# Patient Record
Sex: Male | Born: 1960
Health system: Southern US, Community
[De-identification: ages and names within clinical notes are randomized; demographics above are authoritative.]

## PROBLEM LIST (undated history)

## (undated) DIAGNOSIS — R3129 Other microscopic hematuria: Secondary | ICD-10-CM

## (undated) DIAGNOSIS — I70209 Unspecified atherosclerosis of native arteries of extremities, unspecified extremity: Secondary | ICD-10-CM

## (undated) DIAGNOSIS — E7800 Pure hypercholesterolemia, unspecified: Secondary | ICD-10-CM

## (undated) DIAGNOSIS — N138 Other obstructive and reflux uropathy: Secondary | ICD-10-CM

## (undated) DIAGNOSIS — K649 Unspecified hemorrhoids: Secondary | ICD-10-CM

## (undated) DIAGNOSIS — I739 Peripheral vascular disease, unspecified: Secondary | ICD-10-CM

## (undated) DIAGNOSIS — R7303 Prediabetes: Secondary | ICD-10-CM

## (undated) DIAGNOSIS — N401 Enlarged prostate with lower urinary tract symptoms: Secondary | ICD-10-CM

## (undated) DIAGNOSIS — K219 Gastro-esophageal reflux disease without esophagitis: Secondary | ICD-10-CM

## (undated) DIAGNOSIS — E559 Vitamin D deficiency, unspecified: Secondary | ICD-10-CM

## (undated) DIAGNOSIS — I10 Essential (primary) hypertension: Secondary | ICD-10-CM

## (undated) DIAGNOSIS — Z125 Encounter for screening for malignant neoplasm of prostate: Secondary | ICD-10-CM

## (undated) DIAGNOSIS — N201 Calculus of ureter: Secondary | ICD-10-CM

## (undated) DIAGNOSIS — N433 Hydrocele, unspecified: Secondary | ICD-10-CM

## (undated) HISTORY — DX: Gastro-esophageal reflux disease without esophagitis: K21.9

## (undated) HISTORY — DX: Unspecified atherosclerosis of native arteries of extremities, unspecified extremity: I70.209

## (undated) HISTORY — DX: Peripheral vascular disease, unspecified: I73.9

## (undated) HISTORY — DX: Encounter for screening for malignant neoplasm of prostate: Z12.5

## (undated) HISTORY — PX: COLONOSCOPY: SHX174

## (undated) HISTORY — DX: Other microscopic hematuria: R31.29

## (undated) HISTORY — DX: Pure hypercholesterolemia, unspecified: E78.00

## (undated) HISTORY — DX: Other obstructive and reflux uropathy: N40.1

## (undated) HISTORY — DX: Benign prostatic hyperplasia with lower urinary tract symptoms: N13.8

## (undated) HISTORY — PX: KNEE SURGERY: SHX244

## (undated) HISTORY — DX: Unspecified hemorrhoids: K64.9

## (undated) HISTORY — DX: Calculus of ureter: N20.1

## (undated) HISTORY — DX: Vitamin D deficiency, unspecified: E55.9

## (undated) HISTORY — DX: Prediabetes: R73.03

## (undated) HISTORY — PX: SHOULDER SURGERY: SHX246

## (undated) HISTORY — DX: Hydrocele, unspecified: N43.3

---

## 1999-02-22 ENCOUNTER — Emergency Department (HOSPITAL_COMMUNITY): Admission: EM | Admit: 1999-02-22 | Discharge: 1999-02-22 | Payer: Self-pay | Admitting: Emergency Medicine

## 2000-08-04 ENCOUNTER — Emergency Department (HOSPITAL_COMMUNITY): Admission: EM | Admit: 2000-08-04 | Discharge: 2000-08-05 | Payer: Self-pay | Admitting: Emergency Medicine

## 2000-08-20 ENCOUNTER — Emergency Department (HOSPITAL_COMMUNITY): Admission: EM | Admit: 2000-08-20 | Discharge: 2000-08-20 | Payer: Self-pay | Admitting: *Deleted

## 2000-08-27 ENCOUNTER — Encounter: Payer: Self-pay | Admitting: Orthopedic Surgery

## 2000-08-27 ENCOUNTER — Ambulatory Visit: Admission: RE | Admit: 2000-08-27 | Discharge: 2000-08-27 | Payer: Self-pay | Admitting: Orthopedic Surgery

## 2002-02-04 ENCOUNTER — Ambulatory Visit: Admission: RE | Admit: 2002-02-04 | Discharge: 2002-02-04 | Payer: Self-pay | Admitting: Orthopedic Surgery

## 2002-02-04 ENCOUNTER — Ambulatory Visit (HOSPITAL_COMMUNITY): Admission: RE | Admit: 2002-02-04 | Discharge: 2002-02-04 | Payer: Self-pay | Admitting: Orthopedic Surgery

## 2002-02-04 ENCOUNTER — Encounter: Payer: Self-pay | Admitting: Orthopedic Surgery

## 2002-03-09 ENCOUNTER — Encounter: Admission: RE | Admit: 2002-03-09 | Discharge: 2002-06-07 | Payer: Self-pay | Admitting: Orthopedic Surgery

## 2004-01-14 ENCOUNTER — Emergency Department (HOSPITAL_COMMUNITY): Admission: EM | Admit: 2004-01-14 | Discharge: 2004-01-14 | Payer: Self-pay

## 2004-12-09 ENCOUNTER — Emergency Department (HOSPITAL_COMMUNITY): Admission: EM | Admit: 2004-12-09 | Discharge: 2004-12-09 | Payer: Self-pay | Admitting: Emergency Medicine

## 2005-05-23 ENCOUNTER — Emergency Department (HOSPITAL_COMMUNITY): Admission: EM | Admit: 2005-05-23 | Discharge: 2005-05-23 | Payer: Self-pay | Admitting: Emergency Medicine

## 2005-09-21 ENCOUNTER — Emergency Department (HOSPITAL_COMMUNITY): Admission: EM | Admit: 2005-09-21 | Discharge: 2005-09-22 | Payer: Self-pay | Admitting: *Deleted

## 2006-03-07 ENCOUNTER — Emergency Department (HOSPITAL_COMMUNITY): Admission: EM | Admit: 2006-03-07 | Discharge: 2006-03-08 | Payer: Self-pay | Admitting: Emergency Medicine

## 2006-06-05 ENCOUNTER — Encounter: Admission: RE | Admit: 2006-06-05 | Discharge: 2006-06-05 | Payer: Self-pay | Admitting: *Deleted

## 2006-06-10 ENCOUNTER — Encounter: Payer: Self-pay | Admitting: Gastroenterology

## 2007-06-04 ENCOUNTER — Emergency Department (HOSPITAL_COMMUNITY): Admission: EM | Admit: 2007-06-04 | Discharge: 2007-06-05 | Payer: Self-pay | Admitting: Emergency Medicine

## 2007-12-10 ENCOUNTER — Emergency Department (HOSPITAL_BASED_OUTPATIENT_CLINIC_OR_DEPARTMENT_OTHER): Admission: EM | Admit: 2007-12-10 | Discharge: 2007-12-10 | Payer: Self-pay | Admitting: Emergency Medicine

## 2007-12-11 ENCOUNTER — Emergency Department (HOSPITAL_COMMUNITY): Admission: EM | Admit: 2007-12-11 | Discharge: 2007-12-11 | Payer: Self-pay | Admitting: Family Medicine

## 2008-01-07 ENCOUNTER — Ambulatory Visit: Payer: Self-pay | Admitting: Internal Medicine

## 2008-01-07 ENCOUNTER — Encounter (INDEPENDENT_AMBULATORY_CARE_PROVIDER_SITE_OTHER): Payer: Self-pay | Admitting: *Deleted

## 2008-01-07 DIAGNOSIS — I10 Essential (primary) hypertension: Secondary | ICD-10-CM | POA: Insufficient documentation

## 2008-01-07 DIAGNOSIS — R9431 Abnormal electrocardiogram [ECG] [EKG]: Secondary | ICD-10-CM | POA: Insufficient documentation

## 2008-01-07 DIAGNOSIS — K219 Gastro-esophageal reflux disease without esophagitis: Secondary | ICD-10-CM | POA: Insufficient documentation

## 2008-01-07 DIAGNOSIS — N401 Enlarged prostate with lower urinary tract symptoms: Secondary | ICD-10-CM | POA: Insufficient documentation

## 2008-01-08 ENCOUNTER — Encounter: Payer: Self-pay | Admitting: Internal Medicine

## 2008-02-01 ENCOUNTER — Ambulatory Visit: Payer: Self-pay | Admitting: Internal Medicine

## 2008-02-01 DIAGNOSIS — E876 Hypokalemia: Secondary | ICD-10-CM | POA: Insufficient documentation

## 2008-02-01 DIAGNOSIS — R7309 Other abnormal glucose: Secondary | ICD-10-CM | POA: Insufficient documentation

## 2008-02-01 LAB — CONVERTED CEMR LAB
BUN: 8 mg/dL (ref 6–23)
Basophils Relative: 0.1 % (ref 0.0–3.0)
CO2: 30 meq/L (ref 19–32)
Calcium: 9.2 mg/dL (ref 8.4–10.5)
Chloride: 105 meq/L (ref 96–112)
Creatinine, Ser: 1 mg/dL (ref 0.4–1.5)
GFR calc Af Amer: 102 mL/min
Glucose, Bld: 105 mg/dL — ABNORMAL HIGH (ref 70–99)
HCT: 44.7 % (ref 39.0–52.0)
Hemoglobin: 15 g/dL (ref 13.0–17.0)
MCV: 88 fL (ref 78.0–100.0)
Neutro Abs: 2.9 10*3/uL (ref 1.4–7.7)
Neutrophils Relative %: 53.7 % (ref 43.0–77.0)
Sodium: 142 meq/L (ref 135–145)

## 2008-02-03 ENCOUNTER — Encounter: Admission: RE | Admit: 2008-02-03 | Discharge: 2008-02-03 | Payer: Self-pay | Admitting: Internal Medicine

## 2008-02-03 ENCOUNTER — Encounter: Payer: Self-pay | Admitting: Internal Medicine

## 2008-02-03 ENCOUNTER — Ambulatory Visit: Payer: Self-pay | Admitting: Gastroenterology

## 2008-02-05 ENCOUNTER — Ambulatory Visit: Payer: Self-pay | Admitting: Gastroenterology

## 2008-02-05 HISTORY — PX: COLONOSCOPY: SHX174

## 2008-02-11 ENCOUNTER — Ambulatory Visit: Payer: Self-pay | Admitting: Gastroenterology

## 2008-02-17 ENCOUNTER — Ambulatory Visit: Payer: Self-pay | Admitting: Gastroenterology

## 2008-02-17 ENCOUNTER — Encounter: Payer: Self-pay | Admitting: Internal Medicine

## 2008-02-22 ENCOUNTER — Encounter: Payer: Self-pay | Admitting: Gastroenterology

## 2008-02-25 ENCOUNTER — Ambulatory Visit: Payer: Self-pay | Admitting: Internal Medicine

## 2008-03-28 ENCOUNTER — Telehealth: Payer: Self-pay | Admitting: Gastroenterology

## 2008-03-30 ENCOUNTER — Ambulatory Visit: Payer: Self-pay | Admitting: Gastroenterology

## 2008-04-04 ENCOUNTER — Telehealth: Payer: Self-pay | Admitting: Gastroenterology

## 2008-04-05 ENCOUNTER — Ambulatory Visit: Payer: Self-pay | Admitting: Gastroenterology

## 2008-04-12 ENCOUNTER — Ambulatory Visit: Payer: Self-pay | Admitting: Gastroenterology

## 2008-04-12 ENCOUNTER — Encounter: Payer: Self-pay | Admitting: Gastroenterology

## 2008-04-15 ENCOUNTER — Encounter: Payer: Self-pay | Admitting: Gastroenterology

## 2008-04-15 ENCOUNTER — Telehealth: Payer: Self-pay | Admitting: Gastroenterology

## 2008-04-15 DIAGNOSIS — R142 Eructation: Secondary | ICD-10-CM

## 2008-04-15 DIAGNOSIS — R141 Gas pain: Secondary | ICD-10-CM | POA: Insufficient documentation

## 2008-04-15 DIAGNOSIS — R143 Flatulence: Secondary | ICD-10-CM

## 2008-04-26 ENCOUNTER — Telehealth: Payer: Self-pay | Admitting: Gastroenterology

## 2008-04-29 ENCOUNTER — Ambulatory Visit (HOSPITAL_COMMUNITY): Admission: RE | Admit: 2008-04-29 | Discharge: 2008-04-29 | Payer: Self-pay | Admitting: Gastroenterology

## 2008-05-05 ENCOUNTER — Ambulatory Visit: Payer: Self-pay | Admitting: Gastroenterology

## 2008-06-15 ENCOUNTER — Ambulatory Visit: Payer: Self-pay | Admitting: Internal Medicine

## 2008-07-28 ENCOUNTER — Ambulatory Visit: Payer: Self-pay | Admitting: Internal Medicine

## 2008-07-28 LAB — CONVERTED CEMR LAB
Chloride: 102 meq/L (ref 96–112)
Creatinine, Ser: 1 mg/dL (ref 0.4–1.5)
Glucose, Bld: 84 mg/dL (ref 70–99)
Sodium: 141 meq/L (ref 135–145)

## 2008-09-13 ENCOUNTER — Telehealth: Payer: Self-pay | Admitting: Gastroenterology

## 2008-09-16 ENCOUNTER — Telehealth: Payer: Self-pay | Admitting: Gastroenterology

## 2008-10-11 ENCOUNTER — Telehealth: Payer: Self-pay | Admitting: Gastroenterology

## 2008-10-18 ENCOUNTER — Ambulatory Visit: Payer: Self-pay | Admitting: Internal Medicine

## 2008-10-28 ENCOUNTER — Encounter: Admission: RE | Admit: 2008-10-28 | Discharge: 2008-10-28 | Payer: Self-pay | Admitting: Orthopedic Surgery

## 2008-11-22 ENCOUNTER — Encounter: Admission: RE | Admit: 2008-11-22 | Discharge: 2008-11-22 | Payer: Self-pay | Admitting: Orthopedic Surgery

## 2009-03-13 ENCOUNTER — Emergency Department (HOSPITAL_BASED_OUTPATIENT_CLINIC_OR_DEPARTMENT_OTHER): Admission: EM | Admit: 2009-03-13 | Discharge: 2009-03-13 | Payer: Self-pay | Admitting: Emergency Medicine

## 2009-03-13 ENCOUNTER — Ambulatory Visit: Payer: Self-pay | Admitting: Diagnostic Radiology

## 2009-03-21 ENCOUNTER — Ambulatory Visit (HOSPITAL_BASED_OUTPATIENT_CLINIC_OR_DEPARTMENT_OTHER): Admission: RE | Admit: 2009-03-21 | Discharge: 2009-03-21 | Payer: Self-pay | Admitting: Internal Medicine

## 2009-03-21 ENCOUNTER — Ambulatory Visit: Payer: Self-pay | Admitting: Diagnostic Radiology

## 2010-02-18 ENCOUNTER — Encounter: Payer: Self-pay | Admitting: Orthopedic Surgery

## 2010-02-18 ENCOUNTER — Encounter: Payer: Self-pay | Admitting: *Deleted

## 2010-02-25 LAB — CONVERTED CEMR LAB
AST: 22 units/L (ref 0–37)
Basophils Absolute: 0 10*3/uL (ref 0.0–0.1)
Bilirubin Urine: NEGATIVE
Chloride: 103 meq/L (ref 96–112)
Creatinine, Ser: 1 mg/dL (ref 0.4–1.5)
Crystals: NEGATIVE
Direct LDL: 168 mg/dL
Eosinophils Absolute: 0.2 10*3/uL (ref 0.0–0.7)
GFR calc Af Amer: 102 mL/min
Glucose, Bld: 109 mg/dL — ABNORMAL HIGH (ref 70–99)
Hemoglobin: 14.6 g/dL (ref 13.0–17.0)
Ketones, ur: NEGATIVE mg/dL
Lymphocytes Relative: 57.3 % — ABNORMAL HIGH (ref 12.0–46.0)
MCHC: 33.7 g/dL (ref 30.0–36.0)
Monocytes Relative: 9.2 % (ref 3.0–12.0)
Neutrophils Relative %: 29.6 % — ABNORMAL LOW (ref 43.0–77.0)
Nitrite: NEGATIVE
PSA: 0.65 ng/mL (ref 0.10–4.00)
Platelets: 252 10*3/uL (ref 150–400)
RDW: 12.9 % (ref 11.5–14.6)
Sodium: 139 meq/L (ref 135–145)
Specific Gravity, Urine: 1.015 (ref 1.000–1.03)
TSH: 0.96 microintl units/mL (ref 0.35–5.50)
Total CHOL/HDL Ratio: 5.7
Triglycerides: 58 mg/dL (ref 0–149)
VLDL: 12 mg/dL (ref 0–40)
WBC: 4.7 10*3/uL (ref 4.5–10.5)

## 2010-04-19 LAB — URINALYSIS, ROUTINE W REFLEX MICROSCOPIC
Bilirubin Urine: NEGATIVE
Glucose, UA: NEGATIVE mg/dL
Ketones, ur: NEGATIVE mg/dL
Nitrite: NEGATIVE
Protein, ur: NEGATIVE mg/dL
Urobilinogen, UA: 0.2 mg/dL (ref 0.0–1.0)

## 2010-04-19 LAB — CBC
HCT: 43 % (ref 39.0–52.0)
MCHC: 33.3 g/dL (ref 30.0–36.0)
MCV: 87.1 fL (ref 78.0–100.0)
Platelets: 229 10*3/uL (ref 150–400)
RBC: 4.94 MIL/uL (ref 4.22–5.81)
RDW: 12.8 % (ref 11.5–15.5)

## 2010-04-19 LAB — COMPREHENSIVE METABOLIC PANEL
ALT: 29 U/L (ref 0–53)
AST: 23 U/L (ref 0–37)
Albumin: 4 g/dL (ref 3.5–5.2)
CO2: 34 mEq/L — ABNORMAL HIGH (ref 19–32)
Calcium: 8.7 mg/dL (ref 8.4–10.5)
Chloride: 103 mEq/L (ref 96–112)
GFR calc non Af Amer: 54 mL/min — ABNORMAL LOW (ref >60)
Glucose, Bld: 94 mg/dL (ref 70–99)
Sodium: 142 mEq/L (ref 135–145)

## 2010-04-19 LAB — DIFFERENTIAL
Basophils Absolute: 0.2 10*3/uL — ABNORMAL HIGH (ref 0.0–0.1)
Eosinophils Relative: 3 % (ref 0–5)
Lymphs Abs: 2.4 10*3/uL (ref 0.7–4.0)
Monocytes Absolute: 0.4 10*3/uL (ref 0.1–1.0)
Neutro Abs: 2.2 10*3/uL (ref 1.7–7.7)

## 2010-06-15 NOTE — H&P (Signed)
   NAME:  Christopher Cobb, Christopher Cobb NO.:  [id number]   MEDICAL RECORD NO.:  [id number]                   PATIENT TYPE:  INP   LOCATION:  NA                                   FACILITY:  [facility]   PHYSICIAN:  Myrtie Neither, M.D.                 DATE OF BIRTH:  [birth year]   DATE OF ADMISSION:  DATE OF DISCHARGE:                                HISTORY & PHYSICAL   ADDENDUM:   PLAN:  Arthroscopic anterior cruciate ligament reconstruction with use of  patellar tendon and meniscectomy, right knee.                                               Myrtie Neither, M.D.    AC/MEDQ  D:  02/04/2002  T:  02/04/2002  Job:  [id number]

## 2010-06-15 NOTE — Op Note (Signed)
NAME:  Christopher Cobb, Christopher Cobb NO.:  [id number]   MEDICAL RECORD NO.:  [id number]                   PATIENT TYPE:  INP   LOCATION:  NA                                   FACILITY:  [facility]   PHYSICIAN:  Myrtie Neither, M.D.                 DATE OF BIRTH:  21-Oct-1960   DATE OF PROCEDURE:  02/04/2002  DATE OF DISCHARGE:                                 OPERATIVE REPORT   PREOPERATIVE DIAGNOSIS:  Anterior cruciate ligament tear, meniscal tear  right internal derangement, right knee.   POSTOPERATIVE DIAGNOSIS:  Lateral meniscal tear, chronic synovitis, partial  tear anterior cruciate ligament, synovial cyst, right knee.   ANESTHESIA:  General.   PROCEDURE:  Arthroscopic synovectomy, partial lateral meniscectomy, shaving  and resection of synovial cystic lesion, anterior compartment and partial  resection of avascular portion of the anterior cruciate ligament.   DESCRIPTION OF PROCEDURE:  The patient was taken to the operating room and  was given adequate preoperative medication. He was given general anesthesia  and intubated. The right knee was prepped with Duraprep and prepped and  draped in a sterile manner. A tourniquet was used for hemostasis.   A 1/2 inch puncture wound was made along the anterior medial and lateral  jointline and carried down through the medial suprapatellar pouch area.  Inspection of the joint revealed partial tear of the lateral meniscus,  chronically thickened anterior synovium anterior compartment, partial tear  of the anterior cruciate ligament along its lateral border which appeared to  be avascular; 80% of the ACL was intact. This was tested with the use of a  probe and further inspected.   There was a large synovial cystic lesion which was encompassed in the  anterior compartment of the joint on full extension of the knee. This was  found to impinge against the femoral condyle. This was also right at the  base of the anterior  cruciate ligament, giving the appearance of a stump of  the ACL.   With the synovial shaver, this was completely resected, as well as the  basket forceps. Resection of the avascular small section of the ACL. The  lateral meniscal tear was a degenerative tear and was resected with the use  of basket forceps. The medial meniscus was well preserved. The medial  compartment was well preserved.   Further copious and abundant irrigation was done. The PCL appears to be  intact along its femoral attachment. Further inspection did not reveal any  other  loose fragments.   Wound closure was then done with 4-0 nylon. A compressive dressing was  applied. A knee immobilizer was applied.   The patient tolerated the procedure well and went to the recovery room in  stable and satisfactory condition. The patient was  then discharged to home  on Percocet       1 q.4h. p.r.n. pain. He was advised about the use of  crutches. He was to  return to the office in one week. The patient is being  discharged in stable and satisfactory condition.                                               Myrtie Neither, M.D.    AC/MEDQ  D:  02/04/2002  T:  02/04/2002  Job:  [id number]

## 2010-06-15 NOTE — H&P (Signed)
   NAME:  Christopher Cobb, LANSDOWNE NO.:  [id number]   MEDICAL RECORD NO.:  [id number]                   PATIENT TYPE:  INP   LOCATION:  NA                                   FACILITY:  [facility]   PHYSICIAN:  Myrtie Neither, M.D.                 DATE OF BIRTH:  1961-01-06   DATE OF ADMISSION:  DATE OF DISCHARGE:                                HISTORY & PHYSICAL   CHIEF COMPLAINT:  Painful, locking and giving away of the right knee.   HISTORY OF PRESENT ILLNESS:  This is a 50 year old male who was seen in the  office for pain, catching, locking and swelling of the right knee and a  pinching sensation, difficulty standing for any long period of time with  buckling of the right knee. The patient had an MRI which demonstrated ACL  tear and meniscal tear. The patient had been treated with some  antiinflammatories, water exercises and knee support.   PAST MEDICAL HISTORY:  No history of high blood pressure or diabetes.   ALLERGIES:  SHRIMP.   MEDICATIONS:  Vioxx.   SOCIAL HISTORY:  The patient lives with is wife. He denies smoking or use of  alcohol.   REVIEW OF SYSTEMS:  Basically as per history of present illness. No chronic  respiratory, no urinary or bowel symptoms.   PAST MEDICAL HISTORY:  Left knee arthroscopy and right shoulder arthroscopy.   FAMILY HISTORY:  Noncontributory.   PHYSICAL EXAMINATION:  VITAL SIGNS:  Temperature 99.3, pulse 74,  respirations 16, blood pressure 136/89.  GENERAL:  Height 5 feet, 11 inches. Weight 224. Alert and oriented, in no  acute distress.  HEENT:  Normocephalic, conjunctivae are clear.  NECK:  Supple.  CHEST:  Clear.  CARDIAC:  S1, S2 regular.  EXTREMITIES:  Right knee posterior effusion. Tender anterior jointline with  palpable and audible click, anterior and lateral compartment. Positive  anterior drawer sign. Trace Lachman's test. Quad muscle tone fair. Pain on  full extension of  the right knee and on McMurray's  test with some palpable  and audible click laterally.   LABORATORY DATA:  The patient had MRI which demonstrated ACL tear and  meniscal tear.   IMPRESSION:  Internal derangement, meniscal tear, anterior cruciate ligament  tear, right knee.                                              Myrtie Neither, M.D.   AC/MEDQ  D:  02/04/2002  T:  02/04/2002  Job:  [id number]

## 2010-10-24 IMAGING — CT CT CHEST W/O CM
2 of 3 series · 15 of 36 positions shown, 18 images · non-contrast
Comparison: 10/28/2008

CLINICAL DATA: Abnormal chest x-ray.  Chest pain.

CT CHEST WITHOUT CONTRAST
TECHNIQUE: Multidetector CT imaging of the chest was performed
following the standard protocol without IV contrast.

[Series 2: chest 5.0 b31f · axial · 0.77mm/px · z∈[-275,-55]mm · 12 of 52 slices shown, 15 images]
[im 4/52  mediastinal]
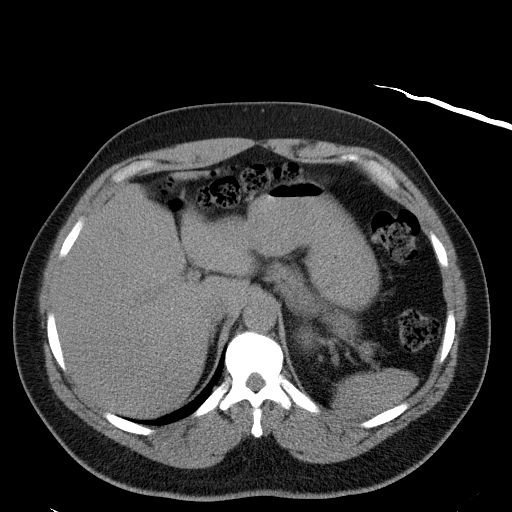
[im 4/52  lung]
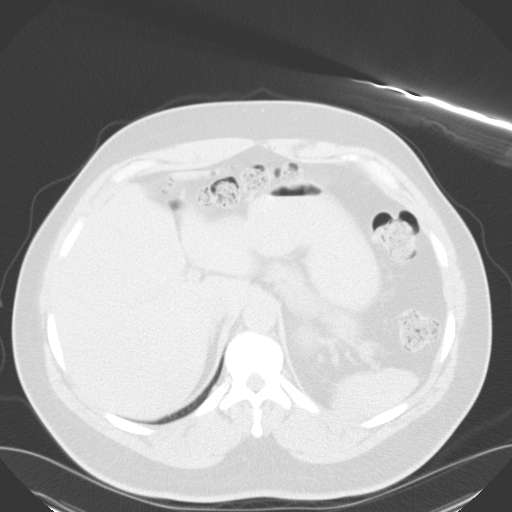
[im 8/52  lung]
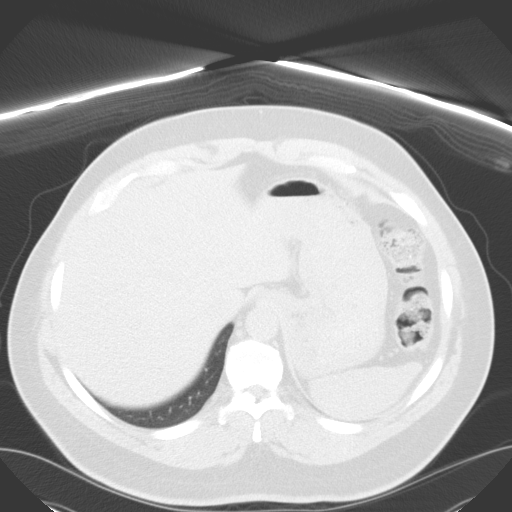
[im 12/52  lung]
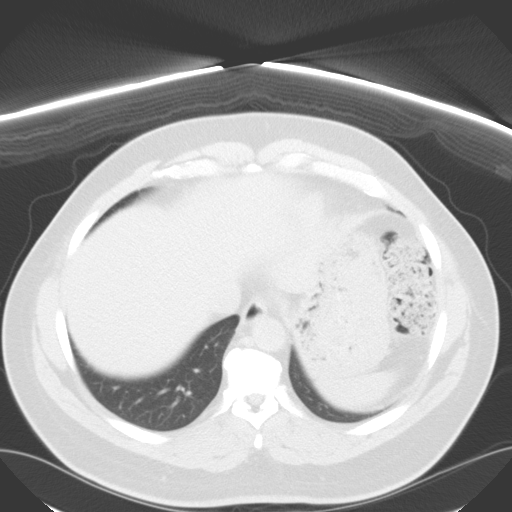
[im 16/52  lung]
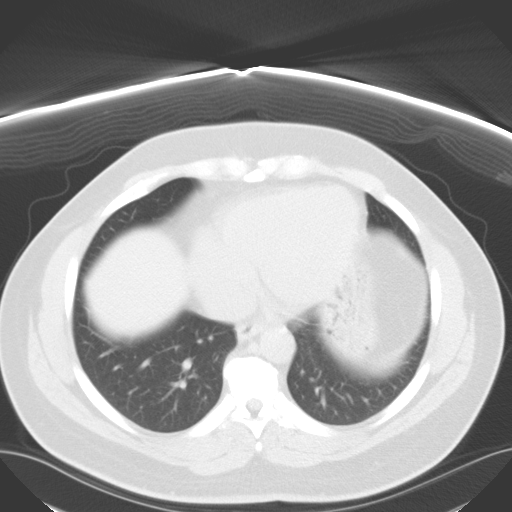
[im 19/52  mediastinal]
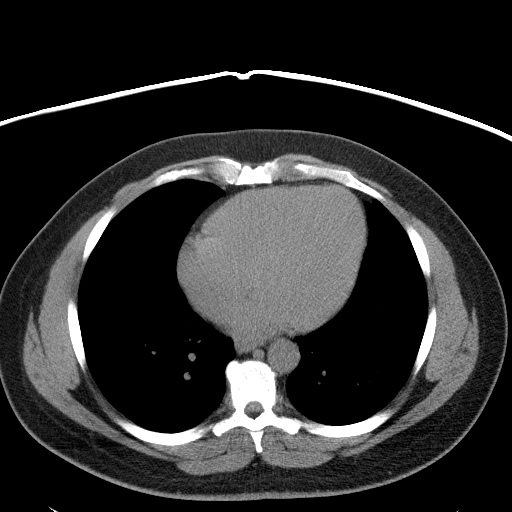
[im 19/52  lung]
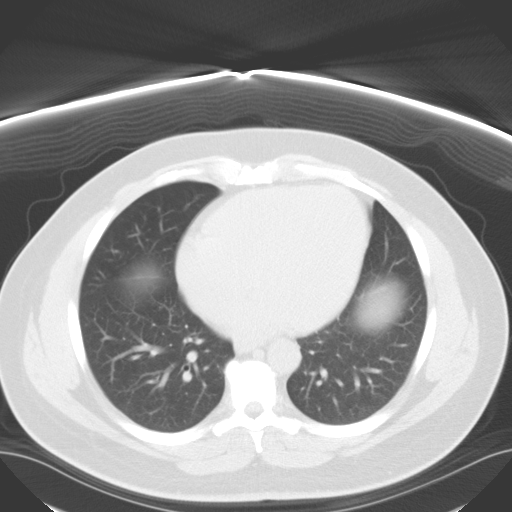
[im 23/52  lung]
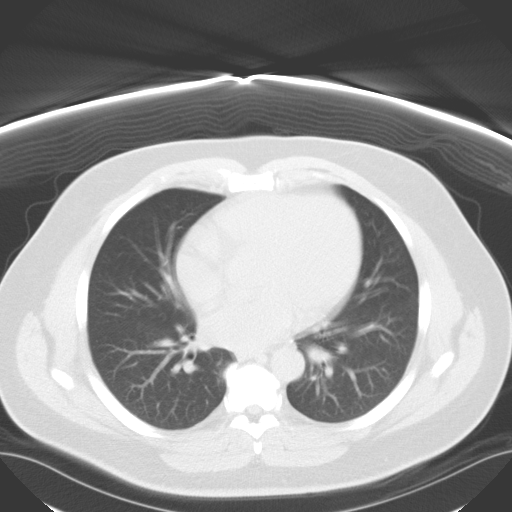
[im 29/52  lung]
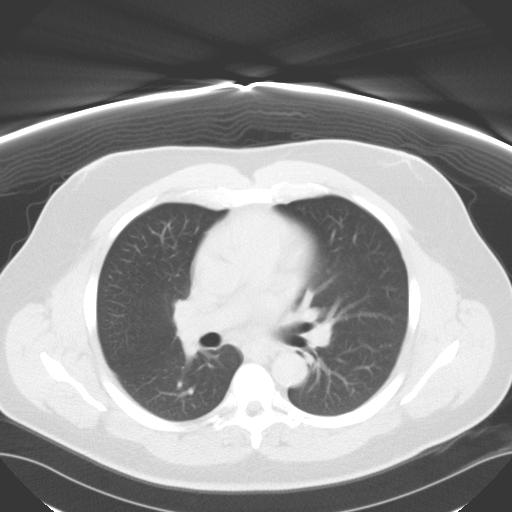
[im 33/52  lung]
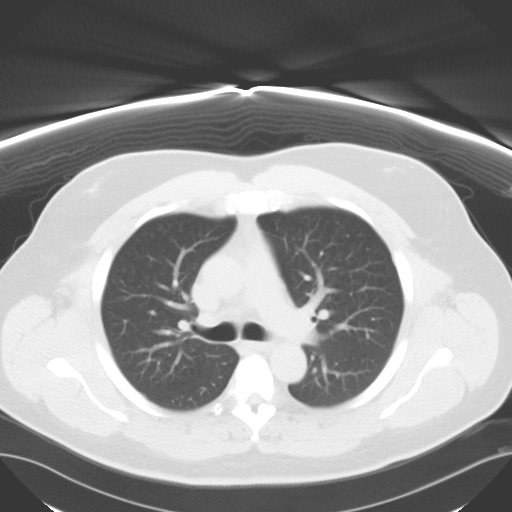
[im 36/52  mediastinal]
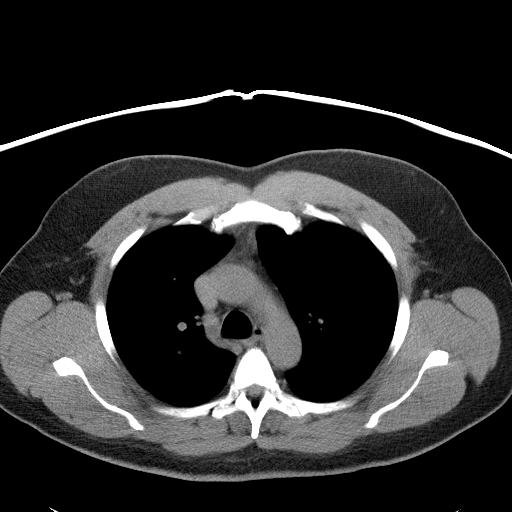
[im 36/52  lung]
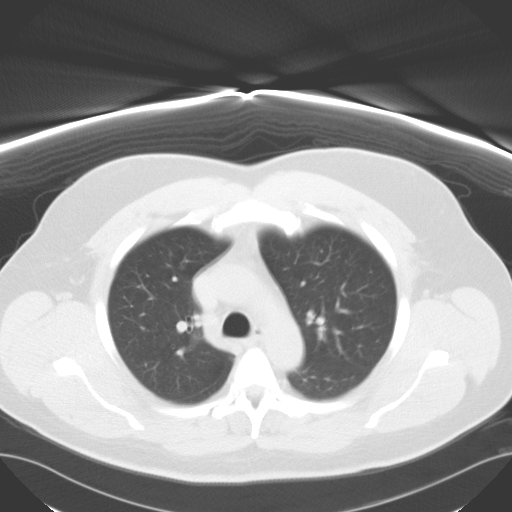
[im 40/52  lung]
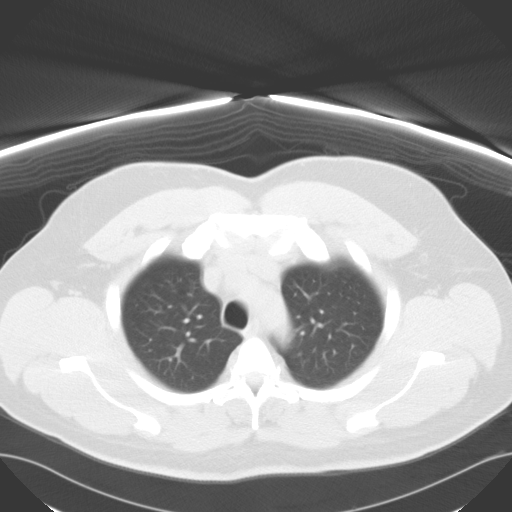
[im 44/52  lung]
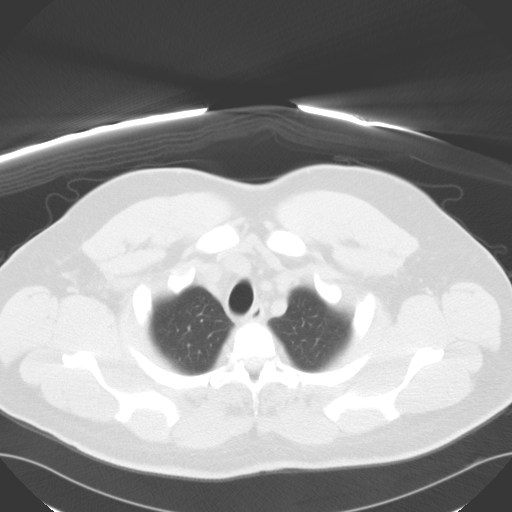
[im 48/52  lung]
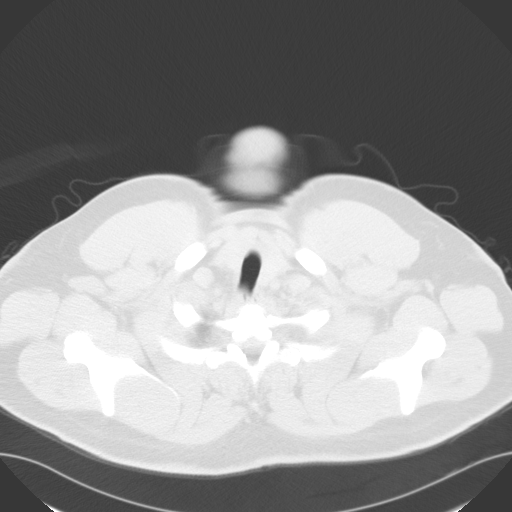

[Series 6: chest 3.0 coronal · coronal · 0.51mm/px · 3 of 90 slices shown]
[im 18/90  lung]
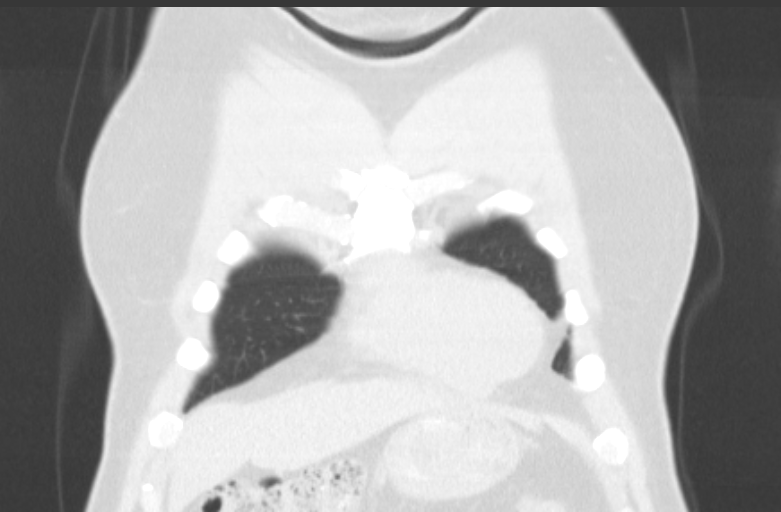
[im 36/90  lung]
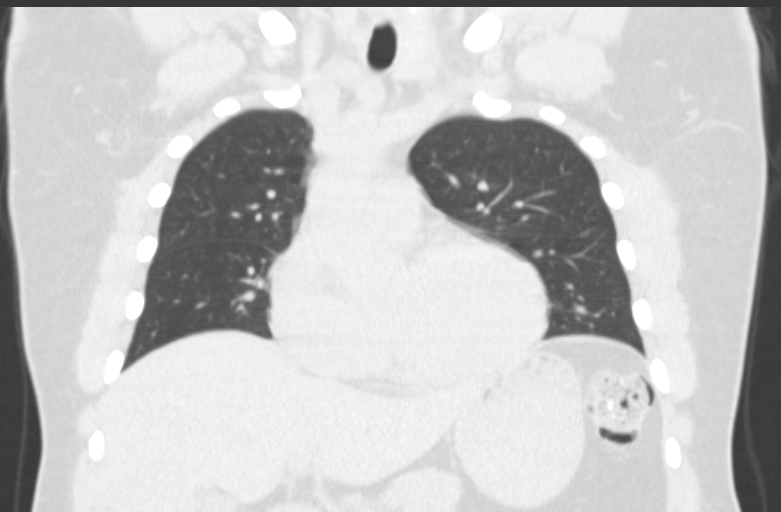
[im 54/90  lung]
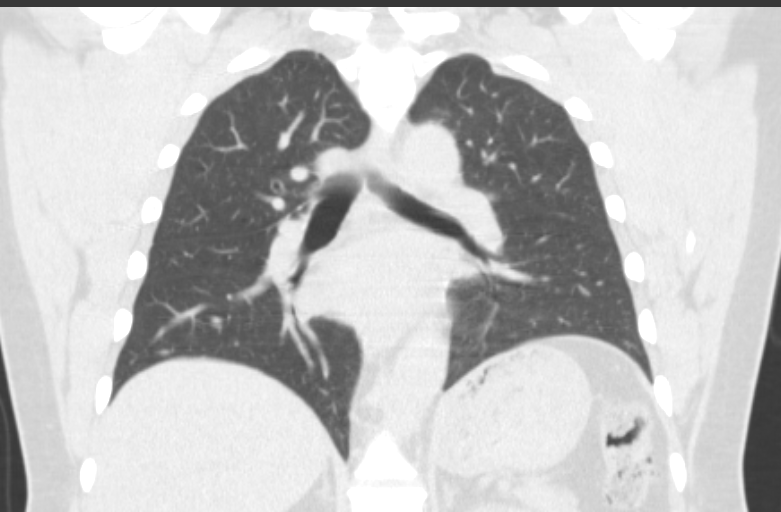

[15 of 36 positions shown; findings below may reference images not displayed]

FINDINGS: The available previous chest x-rays demonstrated no
suspicious or acute finding.  Therefore, I am uncertain what the
abnormality is that is being followed.  If there are outside chest
x-rays with an abnormality, these would be helpful for direct
comparison.

Lungs are clear.  No focal airspace opacities or suspicious
nodules.  No effusions. Heart is normal size. Aorta is normal
caliber. No mediastinal, hilar, or axillary adenopathy.  Visualized
thyroid and chest wall soft tissues unremarkable.

Imaging into the upper abdomen shows no acute findings.  No acute
bony abnormality. Degenerative changes in the thoracic spine.
IMPRESSION: No acute or significant abnormality.  See discussion above.

## 2011-05-29 DIAGNOSIS — N201 Calculus of ureter: Secondary | ICD-10-CM

## 2011-05-29 HISTORY — DX: Calculus of ureter: N20.1

## 2011-06-25 ENCOUNTER — Emergency Department (HOSPITAL_COMMUNITY): Payer: 59

## 2011-06-25 ENCOUNTER — Emergency Department (HOSPITAL_COMMUNITY)
Admission: EM | Admit: 2011-06-25 | Discharge: 2011-06-25 | Disposition: A | Discharge status: Home / Self Care | Payer: 59 | Attending: Emergency Medicine | Admitting: Emergency Medicine

## 2011-06-25 ENCOUNTER — Encounter (HOSPITAL_COMMUNITY): Payer: Self-pay | Admitting: *Deleted

## 2011-06-25 DIAGNOSIS — R110 Nausea: Secondary | ICD-10-CM | POA: Insufficient documentation

## 2011-06-25 DIAGNOSIS — K219 Gastro-esophageal reflux disease without esophagitis: Secondary | ICD-10-CM | POA: Insufficient documentation

## 2011-06-25 DIAGNOSIS — M545 Low back pain, unspecified: Secondary | ICD-10-CM | POA: Insufficient documentation

## 2011-06-25 DIAGNOSIS — N23 Unspecified renal colic: Secondary | ICD-10-CM

## 2011-06-25 DIAGNOSIS — R109 Unspecified abdominal pain: Secondary | ICD-10-CM | POA: Insufficient documentation

## 2011-06-25 DIAGNOSIS — N1330 Unspecified hydronephrosis: Secondary | ICD-10-CM | POA: Insufficient documentation

## 2011-06-25 DIAGNOSIS — N201 Calculus of ureter: Secondary | ICD-10-CM | POA: Insufficient documentation

## 2011-06-25 DIAGNOSIS — I10 Essential (primary) hypertension: Secondary | ICD-10-CM | POA: Insufficient documentation

## 2011-06-25 DIAGNOSIS — Z79899 Other long term (current) drug therapy: Secondary | ICD-10-CM | POA: Insufficient documentation

## 2011-06-25 HISTORY — DX: Essential (primary) hypertension: I10

## 2011-06-25 LAB — BASIC METABOLIC PANEL
GFR calc Af Amer: 89 mL/min — ABNORMAL LOW (ref >90)
GFR calc non Af Amer: 77 mL/min — ABNORMAL LOW (ref >90)
Glucose, Bld: 118 mg/dL — ABNORMAL HIGH (ref 70–99)
Potassium: 3.7 mEq/L (ref 3.5–5.1)
Sodium: 141 mEq/L (ref 135–145)

## 2011-06-25 LAB — CBC
HCT: 41.8 % (ref 39.0–52.0)
MCH: 28.5 pg (ref 26.0–34.0)
MCHC: 33 g/dL (ref 30.0–36.0)
MCV: 86.4 fL (ref 78.0–100.0)
RDW: 13.2 % (ref 11.5–15.5)
WBC: 4.9 10*3/uL (ref 4.0–10.5)

## 2011-06-25 LAB — URINALYSIS, ROUTINE W REFLEX MICROSCOPIC
Ketones, ur: NEGATIVE mg/dL
Nitrite: NEGATIVE
Specific Gravity, Urine: 1.02 (ref 1.005–1.030)
pH: 8 (ref 5.0–8.0)

## 2011-06-25 LAB — DIFFERENTIAL
Basophils Absolute: 0 10*3/uL (ref 0.0–0.1)
Eosinophils Relative: 2 % (ref 0–5)
Lymphocytes Relative: 30 % (ref 12–46)
Monocytes Absolute: 0.5 10*3/uL (ref 0.1–1.0)
Monocytes Relative: 11 % (ref 3–12)
Neutro Abs: 2.8 10*3/uL (ref 1.7–7.7)
Neutrophils Relative %: 57 % (ref 43–77)

## 2011-06-25 MED ORDER — SODIUM CHLORIDE 0.9 % IV BOLUS (SEPSIS)
1000.0000 mL | Freq: Once | INTRAVENOUS | Status: AC
  Administered 2011-06-25: 1000 mL via INTRAVENOUS

## 2011-06-25 MED ORDER — OXYCODONE-ACETAMINOPHEN 5-325 MG PO TABS: 1.0000 | ORAL_TABLET | ORAL | Status: AC | PRN

## 2011-06-25 MED ORDER — TAMSULOSIN HCL 0.4 MG PO CAPS: 0.4000 mg | ORAL_CAPSULE | Freq: Every day | ORAL | Status: DC

## 2011-06-25 MED ORDER — METHOCARBAMOL 100 MG/ML IJ SOLN
1000.0000 mg | Freq: Once | INTRAMUSCULAR | Status: DC
  Filled 2011-06-25: qty 10

## 2011-06-25 MED ORDER — ONDANSETRON HCL 4 MG PO TABS: 4.0000 mg | ORAL_TABLET | Freq: Four times a day (QID) | ORAL | Status: AC

## 2011-06-25 MED ORDER — IBUPROFEN 600 MG PO TABS: 600.0000 mg | ORAL_TABLET | Freq: Four times a day (QID) | ORAL | Status: AC | PRN

## 2011-06-25 MED ORDER — KETOROLAC TROMETHAMINE 30 MG/ML IJ SOLN
30.0000 mg | Freq: Once | INTRAMUSCULAR | Status: AC
Start: 2011-06-25 — End: 2011-06-25
  Administered 2011-06-25: 30 mg via INTRAVENOUS
  Filled 2011-06-25: qty 1

## 2011-06-25 MED ORDER — METHOCARBAMOL 100 MG/ML IJ SOLN
1000.0000 mg | Freq: Once | INTRAVENOUS | Status: AC
  Administered 2011-06-25: 1000 mg via INTRAVENOUS
  Filled 2011-06-25: qty 10

## 2011-06-25 NOTE — ED Notes (Signed)
Per [facility], pt from home with reports of right flank pain and nausea that started last night. Pt also endorses increased urinary frequency through night but denies burning or pain with urination.

## 2011-06-25 NOTE — ED Notes (Signed)
[id number]<QM> Expected date:06/25/11<BR> Expected time:10:58 AM<BR> Means of arrival:Ambulance<BR> Comments:<BR> ems

## 2011-06-25 NOTE — ED Provider Notes (Signed)
History     CSN: [id number]  Arrival date & time 06/25/11  1109   First MD Initiated Contact with Patient 06/25/11 1112      Chief Complaint  Patient presents with  . Flank Pain    right  . Nausea    (Consider location/radiation/quality/duration/timing/severity/associated sxs/prior treatment) HPI Pt started having R sided back pain last night. Worse this morning and worse with movement. States he was lifting a cooler yesterday afternoon. Pain radiates around to RLQ. No hematuria, dysuria, fever, chills, incontinence, weakness, numbness. + mold nausea at times. Was given fentanyl en route with improvement of symptoms  Past Medical History  Diagnosis Date  . Hypertension   . Heartburn     GERD    No past surgical history on file.  No family history on file.  History  Substance Use Topics  . Smoking status: Not on file  . Smokeless tobacco: Not on file  . Alcohol Use:       Review of Systems  Constitutional: Negative for fever and chills.  Gastrointestinal: Positive for nausea. Negative for vomiting and abdominal pain.  Genitourinary: Positive for flank pain. Negative for dysuria, hematuria and difficulty urinating.  Musculoskeletal: Positive for back pain.  Skin: Negative for rash and wound.  Neurological: Negative for weakness, numbness and headaches.    Allergies  Review of patient's allergies indicates no known allergies.  Home Medications   Current Outpatient Rx  Name Route Sig Dispense Refill  . AMLODIPINE BESY-BENAZEPRIL HCL 10-20 MG PO CAPS Oral Take 1 capsule by mouth daily.    Marland Kitchen OMEPRAZOLE 20 MG PO CPDR Oral Take 20 mg by mouth daily.    Marland Kitchen ROSUVASTATIN CALCIUM 10 MG PO TABS Oral Take 20 mg by mouth daily.    . IBUPROFEN 600 MG PO TABS Oral Take 1 tablet (600 mg total) by mouth every 6 (six) hours as needed for pain. 30 tablet 0  . ONDANSETRON HCL 4 MG PO TABS Oral Take 1 tablet (4 mg total) by mouth every 6 (six) hours. 12 tablet 0  .  OXYCODONE-ACETAMINOPHEN 5-325 MG PO TABS Oral Take 1 tablet by mouth every 4 (four) hours as needed for pain. 20 tablet 0  . TAMSULOSIN HCL 0.4 MG PO CAPS Oral Take 1 capsule (0.4 mg total) by mouth daily. 30 capsule 0    BP 122/74  Pulse 61  Temp(Src) 98.3 F (36.8 C) (Oral)  Resp 22  SpO2 100%  Physical Exam  Nursing note and vitals reviewed. Constitutional: He is oriented to person, place, and time. He appears well-developed and well-nourished. No distress.  HENT:  Head: Normocephalic and atraumatic.  Mouth/Throat: Oropharynx is clear and moist.  Eyes: EOM are normal. Pupils are equal, round, and reactive to light.  Neck: Normal range of motion. Neck supple.  Cardiovascular: Normal rate and regular rhythm.   Pulmonary/Chest: Effort normal and breath sounds normal. No respiratory distress. He has no wheezes. He has no rales.  Abdominal: Soft. Bowel sounds are normal. There is no tenderness. There is no rebound and no guarding.  Musculoskeletal: Normal range of motion. He exhibits tenderness. He exhibits no edema.       Mild tenderness to palpation over R lumbar paraspinal muscles and flank. No midline ttp  Neurological: He is alert and oriented to person, place, and time.       5/5 motor in all ext, sensory intact  Skin: Skin is warm and dry. No rash noted. No erythema.  Psychiatric: He has  a normal mood and affect. His behavior is normal.    ED Course  Procedures (including critical care time)  Labs Reviewed  BASIC METABOLIC PANEL - Abnormal; Notable for the following:    Glucose, Bld 118 (*)    GFR calc non Af Amer 77 (*)    GFR calc Af Amer 89 (*)    All other components within normal limits  URINALYSIS, ROUTINE W REFLEX MICROSCOPIC  CBC  DIFFERENTIAL   Ct Abdomen Pelvis Wo Contrast  06/25/2011  *RADIOLOGY REPORT*  Clinical Data:  Right-sided flank pain.  Nausea for 1 day.  CT ABDOMEN AND PELVIS WITHOUT CONTRAST (CT UROGRAM)  Technique: Contiguous axial images of the  abdomen and pelvis without oral or intravenous contrast were obtained.  Comparison: None  Findings:  Exam is limited for evaluation of entities other than urinary tract calculi due to lack of oral or intravenous contrast.   Clear lung bases.  Mild cardiomegaly, without pericardial or pleural effusion.  Probable cyst in the right lobe of the liver, at 7 mm on image 28. 1.2 cm probable cyst in the pericholecystic region of the liver on image 30.  Normal spleen, stomach, pancreas, gallbladder, biliary tract.  No renal calculi.  No left-sided urinary tract obstruction.  Mild right sided urinary tract obstruction with hydroureter continuing to the level of a 2 mm distal distal ureteric stone on transverse image 76 and coronal image 56.  No retroperitoneal or retrocrural adenopathy.  Normal colon, appendix, and terminal ileum.  Normal small bowel without abdominal ascites.  Tiny fat containing right inguinal hernia No pelvic adenopathy. Normal urinary bladder and prostate.  No significant free fluid. Partial degenerative fusion of the bilateral sacroiliac joints.  IMPRESSION: Mild right hydroureter secondary to a distal ureteric stone.  Original Report Authenticated By: Consuello Bossier, M.D.     1. Renal colic on right side       MDM  Pt states he is feeling much better. Will d/c home with pain rx and urology f/u. Given instructions to return immediately for fever, persistent vomiting, uncontrolled pain or any concerns        Loren Racer, MD 06/25/11 1346

## 2011-06-25 NOTE — Discharge Instructions (Signed)

## 2011-09-16 DIAGNOSIS — R350 Frequency of micturition: Secondary | ICD-10-CM | POA: Insufficient documentation

## 2011-09-16 DIAGNOSIS — N441 Cyst of tunica albuginea testis: Secondary | ICD-10-CM | POA: Insufficient documentation

## 2012-01-29 DIAGNOSIS — R3129 Other microscopic hematuria: Secondary | ICD-10-CM

## 2012-01-29 HISTORY — DX: Other microscopic hematuria: R31.29

## 2012-07-29 DIAGNOSIS — R319 Hematuria, unspecified: Secondary | ICD-10-CM | POA: Insufficient documentation

## 2013-11-09 ENCOUNTER — Encounter: Payer: Self-pay | Admitting: Gastroenterology

## 2015-10-31 LAB — CBC AND DIFFERENTIAL
HEMATOCRIT: 43 (ref 41–53)
HEMOGLOBIN: 14 (ref 13.5–17.5)
NEUTROS ABS: 2
PLATELETS: 233 (ref 150–399)
WBC: 4.5

## 2015-10-31 LAB — HEPATIC FUNCTION PANEL
ALK PHOS: 58 (ref 25–125)
ALT: 15 (ref 10–40)
AST: 16 (ref 14–40)
Bilirubin, Total: 0.6

## 2015-10-31 LAB — LIPID PANEL
CHOLESTEROL: 158 (ref 0–200)
HDL: 60 (ref 35–70)
LDL CALC: 89
TRIGLYCERIDES: 44 (ref 40–160)

## 2015-10-31 LAB — BASIC METABOLIC PANEL
BUN: 14 (ref 4–21)
Creatinine: 1.1 (ref 0.6–1.3)
Glucose: 84
Potassium: 4.2 (ref 3.4–5.3)
SODIUM: 146 (ref 137–147)

## 2015-10-31 LAB — HEMOGLOBIN A1C: Hgb A1c MFr Bld: 5.3 (ref 4.0–6.0)

## 2015-10-31 LAB — TSH: TSH: 0.78 (ref 0.41–5.90)

## 2015-10-31 LAB — VITAMIN D 25 HYDROXY (VIT D DEFICIENCY, FRACTURES): Vit D, 25-Hydroxy: 29.4

## 2015-10-31 LAB — PSA: PSA: 1.3

## 2016-02-05 LAB — HM COLONOSCOPY

## 2016-03-11 DIAGNOSIS — N401 Enlarged prostate with lower urinary tract symptoms: Secondary | ICD-10-CM | POA: Diagnosis not present

## 2016-05-22 DIAGNOSIS — R7303 Prediabetes: Secondary | ICD-10-CM | POA: Diagnosis not present

## 2016-05-22 DIAGNOSIS — E785 Hyperlipidemia, unspecified: Secondary | ICD-10-CM | POA: Diagnosis not present

## 2016-05-22 DIAGNOSIS — I10 Essential (primary) hypertension: Secondary | ICD-10-CM | POA: Diagnosis not present

## 2016-06-03 DIAGNOSIS — Z1211 Encounter for screening for malignant neoplasm of colon: Secondary | ICD-10-CM | POA: Diagnosis not present

## 2016-06-03 DIAGNOSIS — K5900 Constipation, unspecified: Secondary | ICD-10-CM | POA: Diagnosis not present

## 2016-06-03 DIAGNOSIS — K602 Anal fissure, unspecified: Secondary | ICD-10-CM | POA: Diagnosis not present

## 2016-06-11 DIAGNOSIS — Z1211 Encounter for screening for malignant neoplasm of colon: Secondary | ICD-10-CM | POA: Diagnosis not present

## 2016-06-11 DIAGNOSIS — K3189 Other diseases of stomach and duodenum: Secondary | ICD-10-CM | POA: Diagnosis not present

## 2016-06-11 DIAGNOSIS — R12 Heartburn: Secondary | ICD-10-CM | POA: Diagnosis not present

## 2016-06-11 DIAGNOSIS — K319 Disease of stomach and duodenum, unspecified: Secondary | ICD-10-CM | POA: Diagnosis not present

## 2016-10-08 DIAGNOSIS — Z0000 Encounter for general adult medical examination without abnormal findings: Secondary | ICD-10-CM | POA: Diagnosis not present

## 2016-10-08 DIAGNOSIS — E559 Vitamin D deficiency, unspecified: Secondary | ICD-10-CM | POA: Diagnosis not present

## 2016-10-08 DIAGNOSIS — R7303 Prediabetes: Secondary | ICD-10-CM | POA: Diagnosis not present

## 2016-10-08 DIAGNOSIS — I10 Essential (primary) hypertension: Secondary | ICD-10-CM | POA: Diagnosis not present

## 2016-10-08 DIAGNOSIS — Z136 Encounter for screening for cardiovascular disorders: Secondary | ICD-10-CM | POA: Diagnosis not present

## 2016-10-08 DIAGNOSIS — Z01118 Encounter for examination of ears and hearing with other abnormal findings: Secondary | ICD-10-CM | POA: Diagnosis not present

## 2016-10-08 DIAGNOSIS — Z131 Encounter for screening for diabetes mellitus: Secondary | ICD-10-CM | POA: Diagnosis not present

## 2017-01-10 DIAGNOSIS — Z719 Counseling, unspecified: Secondary | ICD-10-CM | POA: Diagnosis not present

## 2017-07-22 ENCOUNTER — Ambulatory Visit: Payer: Self-pay | Admitting: Family Medicine

## 2017-08-12 ENCOUNTER — Ambulatory Visit: Payer: Self-pay | Admitting: Family Medicine

## 2017-08-12 DIAGNOSIS — Z0289 Encounter for other administrative examinations: Secondary | ICD-10-CM

## 2017-08-12 NOTE — Progress Notes (Deleted)
Office Note 08/12/2017  CC: No chief complaint on file.   HPI:  Christopher Cobb is a 57 y.o. male who is here to establish care Patient's most recent primary MD: *** Old records *** reviewed prior to or during today's visit.  Past Medical History:  Diagnosis Date  . Heartburn    GERD  . Hypertension     *** The histories are not reviewed yet. Please review them in the "History" navigator section and refresh this SmartLink.  No family history on file.  Social History   Socioeconomic History  . Marital status: Married    Spouse name: Not on file  . Number of children: Not on file  . Years of education: Not on file  . Highest education level: Not on file  Occupational History  . Not on file  Social Needs  . Financial resource strain: Not on file  . Food insecurity:    Worry: Not on file    Inability: Not on file  . Transportation needs:    Medical: Not on file    Non-medical: Not on file  Tobacco Use  . Smoking status: Not on file  Substance and Sexual Activity  . Alcohol use: Not on file  . Drug use: Not on file  . Sexual activity: Not on file  Lifestyle  . Physical activity:    Days per week: Not on file    Minutes per session: Not on file  . Stress: Not on file  Relationships  . Social connections:    Talks on phone: Not on file    Gets together: Not on file    Attends religious service: Not on file    Active member of club or organization: Not on file    Attends meetings of clubs or organizations: Not on file    Relationship status: Not on file  . Intimate partner violence:    Fear of current or ex partner: Not on file    Emotionally abused: Not on file    Physically abused: Not on file    Forced sexual activity: Not on file  Other Topics Concern  . Not on file  Social History Narrative  . Not on file    Outpatient Encounter Medications as of 08/12/2017  Medication Sig  . amLODipine-benazepril (LOTREL) 10-20 MG per capsule Take 1  capsule by mouth daily.  Marland Kitchen omeprazole (PRILOSEC) 20 MG capsule Take 20 mg by mouth daily.  . rosuvastatin (CRESTOR) 10 MG tablet Take 20 mg by mouth daily.  . Tamsulosin HCl (FLOMAX) 0.4 MG CAPS Take 1 capsule (0.4 mg total) by mouth daily.   No facility-administered encounter medications on file as of 08/12/2017.     No Known Allergies  ROS *** PE; There were no vitals taken for this visit. *** Pertinent labs:  Lab Results  Component Value Date   TSH 0.96 01/07/2008   Lab Results  Component Value Date   WBC 4.9 06/25/2011   HGB 13.8 06/25/2011   HCT 41.8 06/25/2011   MCV 86.4 06/25/2011   PLT 222 06/25/2011   Lab Results  Component Value Date   CREATININE 1.08 06/25/2011   BUN 10 06/25/2011   NA 141 06/25/2011   K 3.7 06/25/2011   CL 106 06/25/2011   CO2 25 06/25/2011   Lab Results  Component Value Date   ALT 29 03/13/2009   AST 23 03/13/2009   ALKPHOS 46 03/13/2009   BILITOT 0.4 03/13/2009   Lab Results  Component Value  Date   CHOL 236 (HH) 01/07/2008   Lab Results  Component Value Date   HDL 41.5 01/07/2008   [facility] results found for: [facility]  Component Value Date   TRIG 58 01/07/2008   Lab Results  Component Value Date   CHOLHDL 5.7 CALC 01/07/2008   Lab Results  Component Value Date   PSA 0.65 01/07/2008   Lab Results  Component Value Date   HGBA1C 5.4 02/01/2008    ASSESSMENT AND PLAN:   No problem-specific Assessment & Plan notes found for this encounter.   No follow-ups on file.

## 2017-10-22 ENCOUNTER — Ambulatory Visit: Payer: 59 | Admitting: Family Medicine

## 2017-10-22 ENCOUNTER — Encounter: Payer: Self-pay | Admitting: Family Medicine

## 2017-10-22 VITALS — BP 136/100 | HR 68 | Temp 98.2°F | Resp 16 | Ht 70.5 in | Wt 240.1 lb

## 2017-10-22 DIAGNOSIS — E669 Obesity, unspecified: Secondary | ICD-10-CM | POA: Diagnosis not present

## 2017-10-22 DIAGNOSIS — E7800 Pure hypercholesterolemia, unspecified: Secondary | ICD-10-CM | POA: Diagnosis not present

## 2017-10-22 DIAGNOSIS — I10 Essential (primary) hypertension: Secondary | ICD-10-CM | POA: Diagnosis not present

## 2017-10-22 LAB — COMPREHENSIVE METABOLIC PANEL
ALBUMIN: 4 g/dL (ref 3.5–5.2)
ALK PHOS: 58 U/L (ref 39–117)
ALT: 15 U/L (ref 0–53)
AST: 12 U/L (ref 0–37)
BILIRUBIN TOTAL: 0.6 mg/dL (ref 0.2–1.2)
BUN: 13 mg/dL (ref 6–23)
CALCIUM: 9.2 mg/dL (ref 8.4–10.5)
CO2: 31 mEq/L (ref 19–32)
CREATININE: 0.99 mg/dL (ref 0.40–1.50)
Chloride: 105 mEq/L (ref 96–112)
GFR: 99.43 mL/min (ref >60.00)
Glucose, Bld: 95 mg/dL (ref 70–99)
Potassium: 3.8 mEq/L (ref 3.5–5.1)
Sodium: 142 mEq/L (ref 135–145)
Total Protein: 7.1 g/dL (ref 6.0–8.3)

## 2017-10-22 LAB — LIPID PANEL
CHOL/HDL RATIO: 3
Cholesterol: 160 mg/dL (ref 0–200)
HDL: 46.7 mg/dL (ref >39.00)
LDL CALC: 103 mg/dL — AB (ref 0–99)
NONHDL: 113.3
Triglycerides: 52 mg/dL (ref 0.0–149.0)
VLDL: 10.4 mg/dL (ref 0.0–40.0)

## 2017-10-22 MED ORDER — HYDROCHLOROTHIAZIDE 25 MG PO TABS
25.0000 mg | ORAL_TABLET | Freq: Every day | ORAL | 0 refills | Status: DC
Start: 2017-10-22 — End: 2017-11-11

## 2017-10-22 NOTE — Progress Notes (Signed)
Office Note 10/22/2017  CC:  Chief Complaint  Patient presents with  . Establish Care    Previous PCP: Dr. Leanor Kail at [facility]  . Follow-up    RCI, pt is fasting.    HPI:  Christopher Cobb is a 57 y.o. male who is here to establish care and f/u HTN and HLD. Patient's most recent primary MD: see above Old records in EPIC/[facility] EMR were reviewed prior to or during today's visit.  No home bp monitoring:  Last visit with Dr. Leanor Kail was 1 yr ago. Usually a good exerciser, but none for the last 1 yr, plans on restarting again now. Wt is up to highest of his life now. Tries to eat healthy.  No acute complaints.  Past Medical History:  Diagnosis Date  . BPH with obstruction/lower urinary tract symptoms   . GERD (gastroesophageal reflux disease)   . Hypercholesterolemia   . Hypertension     Past Surgical History:  Procedure Laterality Date  . COLONOSCOPY  02/05/2008   for rectal bleeding: normal (presumed cause was hemorrhoids). ([facility]).  Repeat 2018 normal.    Family History  Problem Relation Age of Onset  . Cervical cancer Mother   . Diabetes Mother   . Hyperlipidemia Mother   . Heart disease Father   . Diabetes Sister     Social History   Socioeconomic History  . Marital status: Married    Spouse name: Not on file  . Number of children: Not on file  . Years of education: Not on file  . Highest education level: Not on file  Occupational History  . Not on file  Social Needs  . Financial resource strain: Not on file  . Food insecurity:    Worry: Not on file    Inability: Not on file  . Transportation needs:    Medical: Not on file    Non-medical: Not on file  Tobacco Use  . Smoking status: Never Smoker  . Smokeless tobacco: Never Used  Substance and Sexual Activity  . Alcohol use: Never    Frequency: Never  . Drug use: Never  . Sexual activity: Not on file  Lifestyle  . Physical activity:    Days per week: Not on file    Minutes per  session: Not on file  . Stress: Not on file  Relationships  . Social connections:    Talks on phone: Not on file    Gets together: Not on file    Attends religious service: Not on file    Active member of club or organization: Not on file    Attends meetings of clubs or organizations: Not on file    Relationship status: Not on file  . Intimate partner violence:    Fear of current or ex partner: Not on file    Emotionally abused: Not on file    Physically abused: Not on file    Forced sexual activity: Not on file  Other Topics Concern  . Not on file  Social History Narrative   Married, 3 children.   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   No T/A/Ds.    Outpatient Encounter Medications as of 10/22/2017  Medication Sig  . amLODipine-benazepril (LOTREL) 10-20 MG per capsule Take 1 capsule by mouth daily.  . rosuvastatin (CRESTOR) 10 MG tablet Take 20 mg by mouth daily.  . hydrochlorothiazide (HYDRODIURIL) 25 MG tablet Take 1 tablet (25 mg total) by  mouth daily.  . [DISCONTINUED] omeprazole (PRILOSEC) 20 MG capsule Take 20 mg by mouth daily.  . [DISCONTINUED] Tamsulosin HCl (FLOMAX) 0.4 MG CAPS Take 1 capsule (0.4 mg total) by mouth daily. (Patient not taking: Reported on 10/22/2017)   No facility-administered encounter medications on file as of 10/22/2017.     No Known Allergies  ROS Review of Systems  Constitutional: Negative for fatigue and fever.  HENT: Negative for congestion and sore throat.   Eyes: Negative for visual disturbance.  Respiratory: Negative for cough.   Cardiovascular: Negative for chest pain.  Gastrointestinal: Negative for abdominal pain and nausea.  Genitourinary: Negative for dysuria.  Musculoskeletal: Negative for back pain and joint swelling.  Skin: Negative for rash.  Neurological: Negative for weakness and headaches.  Hematological: Negative for adenopathy.    PE;  Initial bp 144/87, repeat manual  136/100 Blood pressure (!) 136/100, pulse 68, temperature 98.2 F (36.8 C), temperature source Oral, resp. rate 16, height 5' 10.5" (1.791 m), weight 240 lb 2 oz (108.9 kg), SpO2 99 %. Body mass index is 33.97 kg/m.  Gen: Alert, well appearing.  Patient is oriented to person, place, time, and situation. AFFECT: pleasant, lucid thought and speech. ZOX:WRUE: no injection, icteris, swelling, or exudate.  EOMI, PERRLA. Mouth: lips without lesion/swelling.  Oral mucosa pink and moist. Oropharynx without erythema, exudate, or swelling.  Neck - No masses or thyromegaly or limitation in range of motion CV: RRR, no m/r/g.   LUNGS: CTA bilat, nonlabored resps, good aeration in all lung fields. ABD: soft, NT/ND EXT: no clubbing or cyanosis.  no edema.   Pertinent labs:  Lab Results  Component Value Date   TSH 0.96 01/07/2008   Lab Results  Component Value Date   WBC 4.9 06/25/2011   HGB 13.8 06/25/2011   HCT 41.8 06/25/2011   MCV 86.4 06/25/2011   PLT 222 06/25/2011   Lab Results  Component Value Date   CREATININE 1.08 06/25/2011   BUN 10 06/25/2011   NA 141 06/25/2011   K 3.7 06/25/2011   CL 106 06/25/2011   CO2 25 06/25/2011   Lab Results  Component Value Date   ALT 29 03/13/2009   AST 23 03/13/2009   ALKPHOS 46 03/13/2009   BILITOT 0.4 03/13/2009   Lab Results  Component Value Date   CHOL 236 (HH) 01/07/2008   Lab Results  Component Value Date   HDL 41.5 01/07/2008   No results found for: [facility] Lab Results  Component Value Date   TRIG 58 01/07/2008   Lab Results  Component Value Date   CHOLHDL 5.7 CALC 01/07/2008   Lab Results  Component Value Date   PSA 0.65 01/07/2008   Lab Results  Component Value Date   HGBA1C 5.4 02/01/2008    ASSESSMENT AND PLAN:   New pt: obtain prior PCP records.  1) HTN, uncontrolled: add hctz 25mg  qd. Increase exercise.  Low Na diet. Home bp monitoring discussed-->bp cuff advise given, handout about checking bp at home  was reviewed and given to pt today.  2) HLD: tolerating statin.  Per pt it has been 1 yr since this was checked. FLP today, hepatic panel today.  An After Visit Summary was printed and given to the patient.  Return in about 2 weeks (around 11/05/2017) for annual CPE (fasting)+ recheck HTN.  Signed:  Santiago Bumpers, MD           10/22/2017

## 2017-10-27 ENCOUNTER — Other Ambulatory Visit: Payer: Self-pay | Admitting: Family Medicine

## 2017-10-27 MED ORDER — AMLODIPINE BESY-BENAZEPRIL HCL 10-20 MG PO CAPS: 1.0000 | ORAL_CAPSULE | Freq: Every day | ORAL | 1 refills | Status: DC

## 2017-10-27 NOTE — Telephone Encounter (Signed)
Copied from CRM [id number]. Topic: Quick Communication - See Telephone Encounter >> Oct 27, 2017  3:10 PM Terisa Starr wrote: CRM for notification. See Telephone encounter for: 10/27/17.  Patient was just in the office with Dr Milinda Cave on 9/25 and forgot to mention this to him. He would like Dr Milinda Cave to take this over.  amLODipine-benazepril (LOTREL) 10-20 MG per capsule [id number] [place] Otto, [place] - [address] AT [facility] [address] [place] [address]

## 2017-10-27 NOTE — Telephone Encounter (Signed)
Rx refill request: amlodipine- benazepril 10-20      Last ordered: 06/25/11  Historical provider  Patient is requesting PCP- manage this Rx for him.  LOV: 10/22/17  PCP: [facility]  Pharmacy: verified

## 2017-10-28 ENCOUNTER — Encounter: Payer: Self-pay | Admitting: Family Medicine

## 2017-10-28 ENCOUNTER — Encounter: Payer: Self-pay | Admitting: *Deleted

## 2017-10-28 ENCOUNTER — Telehealth: Payer: Self-pay | Admitting: *Deleted

## 2017-10-28 NOTE — Telephone Encounter (Signed)
Received medical records from [facility].  I reviewed records and abstracted information into pts chart.   Records have been placed on Dr. Samul Dada desk for review.

## 2017-11-11 ENCOUNTER — Ambulatory Visit (INDEPENDENT_AMBULATORY_CARE_PROVIDER_SITE_OTHER): Payer: 59 | Admitting: Family Medicine

## 2017-11-11 ENCOUNTER — Encounter: Payer: Self-pay | Admitting: Family Medicine

## 2017-11-11 VITALS — BP 122/81 | HR 76 | Temp 98.4°F | Resp 16 | Ht 70.5 in | Wt 238.2 lb

## 2017-11-11 DIAGNOSIS — I10 Essential (primary) hypertension: Secondary | ICD-10-CM | POA: Diagnosis not present

## 2017-11-11 DIAGNOSIS — R7303 Prediabetes: Secondary | ICD-10-CM | POA: Diagnosis not present

## 2017-11-11 DIAGNOSIS — B350 Tinea barbae and tinea capitis: Secondary | ICD-10-CM | POA: Diagnosis not present

## 2017-11-11 DIAGNOSIS — Z23 Encounter for immunization: Secondary | ICD-10-CM | POA: Diagnosis not present

## 2017-11-11 DIAGNOSIS — Z0000 Encounter for general adult medical examination without abnormal findings: Secondary | ICD-10-CM

## 2017-11-11 DIAGNOSIS — E669 Obesity, unspecified: Secondary | ICD-10-CM

## 2017-11-11 DIAGNOSIS — Z9189 Other specified personal risk factors, not elsewhere classified: Secondary | ICD-10-CM

## 2017-11-11 DIAGNOSIS — Z125 Encounter for screening for malignant neoplasm of prostate: Secondary | ICD-10-CM

## 2017-11-11 DIAGNOSIS — Z114 Encounter for screening for human immunodeficiency virus [HIV]: Secondary | ICD-10-CM

## 2017-11-11 DIAGNOSIS — Z1159 Encounter for screening for other viral diseases: Secondary | ICD-10-CM

## 2017-11-11 LAB — CBC WITH DIFFERENTIAL/PLATELET
BASOS ABS: 0 10*3/uL (ref 0.0–0.1)
Basophils Relative: 0.8 % (ref 0.0–3.0)
EOS ABS: 0.3 10*3/uL (ref 0.0–0.7)
Eosinophils Relative: 8.2 % — ABNORMAL HIGH (ref 0.0–5.0)
HCT: 44.2 % (ref 39.0–52.0)
HEMOGLOBIN: 14.3 g/dL (ref 13.0–17.0)
LYMPHS PCT: 52.5 % — AB (ref 12.0–46.0)
Lymphs Abs: 2 10*3/uL (ref 0.7–4.0)
MCHC: 32.4 g/dL (ref 30.0–36.0)
MCV: 86.9 fl (ref 78.0–100.0)
MONOS PCT: 10.1 % (ref 3.0–12.0)
Monocytes Absolute: 0.4 10*3/uL (ref 0.1–1.0)
Neutro Abs: 1.1 10*3/uL — ABNORMAL LOW (ref 1.4–7.7)
Neutrophils Relative %: 28.4 % — ABNORMAL LOW (ref 43.0–77.0)
Platelets: 218 10*3/uL (ref 150.0–400.0)
RBC: 5.09 Mil/uL (ref 4.22–5.81)
RDW: 13.5 % (ref 11.5–15.5)
WBC: 3.9 10*3/uL — AB (ref 4.0–10.5)

## 2017-11-11 LAB — TSH: TSH: 0.88 u[IU]/mL (ref 0.35–4.50)

## 2017-11-11 LAB — HEMOGLOBIN A1C: Hgb A1c MFr Bld: 5.3 % (ref 4.6–6.5)

## 2017-11-11 LAB — PSA: PSA: 1.48 ng/mL (ref 0.10–4.00)

## 2017-11-11 MED ORDER — HYDROCHLOROTHIAZIDE 25 MG PO TABS: 25.0000 mg | ORAL_TABLET | Freq: Every day | ORAL | 1 refills | Status: DC

## 2017-11-11 MED ORDER — KETOCONAZOLE 2 % EX SHAM: MEDICATED_SHAMPOO | CUTANEOUS | 2 refills | Status: DC

## 2017-11-11 NOTE — Patient Instructions (Signed)

## 2017-11-11 NOTE — Progress Notes (Signed)
Office Note 11/11/2017  CC:  Chief Complaint  Patient presents with  . Annual Exam    Pt is fasting.    HPI:  Christopher Cobb is a 57 y.o. male who is here for annual health maintenance exam.  Also, f/u HTN: last visit I added hctz 25mg  qd to his lotrel 10-20 qd. Home bp monitoring was encouraged at that time:  bp at home 120s/80 or better.   Feeling fine.   Past Medical History:  Diagnosis Date  . Atherosclerosis of native artery of extremity (HCC)   . BPH with obstruction/lower urinary tract symptoms   . GERD (gastroesophageal reflux disease)   . Hemorrhoids   . Hypercholesterolemia   . Hypertension   . Prediabetes   . PVD (peripheral vascular disease) (HCC)   . Vitamin D deficiency     Past Surgical History:  Procedure Laterality Date  . COLONOSCOPY  02/05/2008   for rectal bleeding: normal (presumed cause was hemorrhoids). ([facility]).  Repeat 2018 normal.  . KNEE SURGERY    . SHOULDER SURGERY      Family History  Problem Relation Age of Onset  . Cervical cancer Mother   . Diabetes Mother   . Hyperlipidemia Mother   . Heart disease Father   . Diabetes Sister     Social History   Socioeconomic History  . Marital status: Married    Spouse name: Not on file  . Number of children: Not on file  . Years of education: Not on file  . Highest education level: Not on file  Occupational History  . Not on file  Social Needs  . Financial resource strain: Not on file  . Food insecurity:    Worry: Not on file    Inability: Not on file  . Transportation needs:    Medical: Not on file    Non-medical: Not on file  Tobacco Use  . Smoking status: Never Smoker  . Smokeless tobacco: Never Used  Substance and Sexual Activity  . Alcohol use: Never    Frequency: Never  . Drug use: Never  . Sexual activity: Not on file  Lifestyle  . Physical activity:    Days per week: Not on file    Minutes per session: Not on file  . Stress: Not on file   Relationships  . Social connections:    Talks on phone: Not on file    Gets together: Not on file    Attends religious service: Not on file    Active member of club or organization: Not on file    Attends meetings of clubs or organizations: Not on file    Relationship status: Not on file  . Intimate partner violence:    Fear of current or ex partner: Not on file    Emotionally abused: Not on file    Physically abused: Not on file    Forced sexual activity: Not on file  Other Topics Concern  . Not on file  Social History Narrative   Married, 3 children.   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   No T/A/Ds.    Outpatient Medications Prior to Visit  Medication Sig Dispense Refill  . amLODipine-benazepril (LOTREL) 10-20 MG capsule Take 1 capsule by mouth daily. 90 capsule 1  . rosuvastatin (CRESTOR) 10 MG tablet Take 10 mg by mouth daily.     . hydrochlorothiazide (HYDRODIURIL) 25 MG tablet Take 1 tablet (25 mg total)  by mouth daily. 30 tablet 0   No facility-administered medications prior to visit.     No Known Allergies  ROS Review of Systems  Constitutional: Negative for appetite change, chills, fatigue and fever.  HENT: Negative for congestion, dental problem, ear pain and sore throat.   Eyes: Negative for discharge, redness and visual disturbance.  Respiratory: Negative for cough, chest tightness, shortness of breath and wheezing.   Cardiovascular: Negative for chest pain, palpitations and leg swelling.  Gastrointestinal: Negative for abdominal pain, blood in stool, diarrhea, nausea and vomiting.  Genitourinary: Negative for difficulty urinating, dysuria, flank pain, frequency, hematuria and urgency.  Musculoskeletal: Negative for arthralgias, back pain, joint swelling, myalgias and neck stiffness.  Skin: Positive for rash (little tiny patches of "bumps" with associated patchy hair loss--pt has shaved his head completelly). Negative for  pallor.  Neurological: Negative for dizziness, speech difficulty, weakness and headaches.  Hematological: Negative for adenopathy. Does not bruise/bleed easily.  Psychiatric/Behavioral: Negative for confusion and sleep disturbance. The patient is not nervous/anxious.     PE; Blood pressure 122/81, pulse 76, temperature 98.4 F (36.9 C), temperature source Oral, resp. rate 16, height 5' 10.5" (1.791 m), weight 238 lb 4 oz (108.1 kg), SpO2 100 %. Gen: Alert, well appearing.  Patient is oriented to person, place, time, and situation. AFFECT: pleasant, lucid thought and speech. ENT: Ears: EACs clear, normal epithelium.  TMs with good light reflex and landmarks bilaterally.  Eyes: no injection, icteris, swelling, or exudate.  EOMI, PERRLA. Nose: no drainage or turbinate edema/swelling.  No injection or focal lesion.  Mouth: lips without lesion/swelling.  Oral mucosa pink and moist.  Dentition intact and without obvious caries or gingival swelling.  Oropharynx without erythema, exudate, or swelling.  Neck: supple/nontender.  No LAD, mass, or TM.  Carotid pulses 2+ bilaterally, without bruits. CV: RRR, no m/r/g.   LUNGS: CTA bilat, nonlabored resps, good aeration in all lung fields. ABD: soft, NT, ND, BS normal.  No hepatospenomegaly or mass.  No bruits. EXT: no clubbing, cyanosis, or edema.  Musculoskeletal: no joint swelling, erythema, warmth, or tenderness.  ROM of all joints intact. Skin - no sores or suspicious lesions or rashes or color changes. Scalp is completely bald.  He has some tiny pinkish papules that are scattered diffusely, some arranged in oval pattern with central clearing.  Some tiny patches of complete absence of hair. Rectal: pt deferred--see A/P.  Pertinent labs:  Lab Results  Component Value Date   TSH 0.78 10/31/2015   Lab Results  Component Value Date   WBC 4.5 10/31/2015   HGB 14.0 10/31/2015   HCT 43 10/31/2015   MCV 86.4 06/25/2011   PLT 233 10/31/2015   Lab  Results  Component Value Date   CREATININE 0.99 10/22/2017   BUN 13 10/22/2017   NA 142 10/22/2017   K 3.8 10/22/2017   CL 105 10/22/2017   CO2 31 10/22/2017   Lab Results  Component Value Date   ALT 15 10/22/2017   AST 12 10/22/2017   ALKPHOS 58 10/22/2017   BILITOT 0.6 10/22/2017   Lab Results  Component Value Date   CHOL 160 10/22/2017   Lab Results  Component Value Date   HDL 46.70 10/22/2017   Lab Results  Component Value Date   LDLCALC 103 (H) 10/22/2017   Lab Results  Component Value Date   TRIG 52.0 10/22/2017   Lab Results  Component Value Date   CHOLHDL 3 10/22/2017   Lab Results  Component Value Date   PSA 1.3 10/31/2015   PSA 0.65 01/07/2008   Lab Results  Component Value Date   HGBA1C 5.3 10/31/2015    ASSESSMENT AND PLAN:   1) HTN: controlled now s/p recent addition of hctz to regimen.  2) Tinea capitis vs corporis: scalp. Trial of ketoconazole shampoo 2%, three times per week.  3) Health maintenance exam: Reviewed age and gender appropriate health maintenance issues (prudent diet, regular exercise, health risks of tobacco and excessive alcohol, use of seatbelts, fire alarms in home, use of sunscreen).  Also reviewed age and gender appropriate health screening as well as vaccine recommendations. Vaccines:  Flu vaccine today. Labs: CBC, TSH, HbA1c (prediabetes). Prostate ca screening: DRE , PSA-> pt defers this today--pt tells me today he gets prostate ca screening through a urologist at [facility]. Colon ca screening: next colonoscopy 2028.  An After Visit Summary was printed and given to the patient.  FOLLOW UP:  Return in about 6 months (around 05/13/2018) for routine chronic illness f/u.  Signed:  Santiago Bumpers, MD           11/11/2017

## 2017-11-11 NOTE — Addendum Note (Signed)
Addended by: Regan Rakers on: 11/11/2017 08:55 AM   Modules accepted: Orders

## 2017-12-08 ENCOUNTER — Other Ambulatory Visit: Payer: Self-pay | Admitting: Family Medicine

## 2017-12-08 ENCOUNTER — Telehealth: Payer: Self-pay | Admitting: Family Medicine

## 2017-12-08 NOTE — Telephone Encounter (Signed)
I have completed what I can on this form, form has been placed on Dr. Samul Dada desk for review/completetion.

## 2017-12-08 NOTE — Telephone Encounter (Signed)
Patient dropped off Health Provider Screening form. Patient requests form to be faxed and a copy to be mailed to their home address.

## 2017-12-08 NOTE — Telephone Encounter (Signed)
Newer pt, we have not Rx'ed this medication yet. Please advise. Thanks.

## 2017-12-08 NOTE — Telephone Encounter (Signed)
Form completed/signed and put on Heather's desk.

## 2017-12-08 NOTE — Telephone Encounter (Signed)
Form faxed and mailed to address in EMR.  Copy made for chart and sent to scan.

## 2017-12-22 DIAGNOSIS — N401 Enlarged prostate with lower urinary tract symptoms: Secondary | ICD-10-CM | POA: Diagnosis not present

## 2017-12-22 DIAGNOSIS — R351 Nocturia: Secondary | ICD-10-CM | POA: Diagnosis not present

## 2017-12-22 DIAGNOSIS — Z125 Encounter for screening for malignant neoplasm of prostate: Secondary | ICD-10-CM

## 2017-12-22 HISTORY — DX: Encounter for screening for malignant neoplasm of prostate: Z12.5

## 2017-12-28 ENCOUNTER — Encounter: Payer: Self-pay | Admitting: Family Medicine

## 2018-02-10 ENCOUNTER — Encounter: Payer: Self-pay | Admitting: Family Medicine

## 2018-02-10 ENCOUNTER — Ambulatory Visit: Payer: 59 | Admitting: Family Medicine

## 2018-02-10 VITALS — BP 140/82 | HR 77 | Temp 98.0°F | Resp 16 | Ht 70.0 in | Wt 237.0 lb

## 2018-02-10 DIAGNOSIS — J0110 Acute frontal sinusitis, unspecified: Secondary | ICD-10-CM | POA: Diagnosis not present

## 2018-02-10 DIAGNOSIS — H6122 Impacted cerumen, left ear: Secondary | ICD-10-CM | POA: Diagnosis not present

## 2018-02-10 MED ORDER — DOXYCYCLINE HYCLATE 100 MG PO TABS: 100.0000 mg | ORAL_TABLET | Freq: Two times a day (BID) | ORAL | 0 refills | Status: DC

## 2018-02-10 MED ORDER — FLUTICASONE PROPIONATE 50 MCG/ACT NA SUSP: 2.0000 | Freq: Every day | NASAL | 6 refills | Status: DC

## 2018-02-10 NOTE — Patient Instructions (Signed)
Rest, hydrate.  Start  flonase nasal spray (prescribed) daily as instructed.  Doxycyline  prescribed, take until completed. For sinus infection.  Your left ear had ear wax - it was removed completely today.  If cough present it can last up to 6-8 weeks.  F/U 2 weeks of not improved.     Sinusitis, Adult Sinusitis is soreness and swelling (inflammation) of your sinuses. Sinuses are hollow spaces in the bones around your face. They are located:  Around your eyes.  In the middle of your forehead.  Behind your nose.  In your cheekbones. Your sinuses and nasal passages are lined with a fluid called mucus. Mucus drains out of your sinuses. Swelling can trap mucus in your sinuses. This lets germs (bacteria, virus, or fungus) grow, which leads to infection. Most of the time, this condition is caused by a virus. What are the causes? This condition is caused by:  Allergies.  Asthma.  Germs.  Things that block your nose or sinuses.  Growths in the nose (nasal polyps).  Chemicals or irritants in the air.  Fungus (rare). What increases the risk? You are more likely to develop this condition if:  You have a weak body defense system (immune system).  You do a lot of swimming or diving.  You use nasal sprays too much.  You smoke. What are the signs or symptoms? The main symptoms of this condition are pain and a feeling of pressure around the sinuses. Other symptoms include:  Stuffy nose (congestion).  Runny nose (drainage).  Swelling and warmth in the sinuses.  Headache.  Toothache.  A cough that may get worse at night.  Mucus that collects in the throat or the back of the nose (postnasal drip).  Being unable to smell and taste.  Being very tired (fatigue).  A fever.  Sore throat.  Bad breath. How is this diagnosed? This condition is diagnosed based on:  Your symptoms.  Your medical history.  A physical exam.  Tests to find out if your condition is  short-term (acute) or long-term (chronic). Your doctor may: ? Check your nose for growths (polyps). ? Check your sinuses using a tool that has a light (endoscope). ? Check for allergies or germs. ? Do imaging tests, such as an MRI or CT scan. How is this treated? Treatment for this condition depends on the cause and whether it is short-term or long-term.  If caused by a virus, your symptoms should go away on their own within 10 days. You may be given medicines to relieve symptoms. They include: ? Medicines that shrink swollen tissue in the nose. ? Medicines that treat allergies (antihistamines). ? A spray that treats swelling of the nostrils. ? Rinses that help get rid of thick mucus in your nose (nasal saline washes).  If caused by bacteria, your doctor may wait to see if you will get better without treatment. You may be given antibiotic medicine if you have: ? A very bad infection. ? A weak body defense system.  If caused by growths in the nose, you may need to have surgery. Follow these instructions at home: Medicines  Take, use, or apply over-the-counter and prescription medicines only as told by your doctor. These may include nasal sprays.  If you were prescribed an antibiotic medicine, take it as told by your doctor. Do not stop taking the antibiotic even if you start to feel better. Hydrate and humidify   Drink enough water to keep your pee (urine) pale yellow.  Use a cool mist humidifier to keep the humidity level in your home above 50%.  Breathe in steam for 10-15 minutes, 3-4 times a day, or as told by your doctor. You can do this in the bathroom while a hot shower is running.  Try not to spend time in cool or dry air. Rest  Rest as much as you can.  Sleep with your head raised (elevated).  Make sure you get enough sleep each night. General instructions   Put a warm, moist washcloth on your face 3-4 times a day, or as often as told by your doctor. This will  help with discomfort.  Wash your hands often with soap and water. If there is no soap and water, use hand sanitizer.  Do not smoke. Avoid being around people who are smoking (secondhand smoke).  Keep all follow-up visits as told by your doctor. This is important. Contact a doctor if:  You have a fever.  Your symptoms get worse.  Your symptoms do not get better within 10 days. Get help right away if:  You have a very bad headache.  You cannot stop throwing up (vomiting).  You have very bad pain or swelling around your face or eyes.  You have trouble seeing.  You feel confused.  Your neck is stiff.  You have trouble breathing. Summary  Sinusitis is swelling of your sinuses. Sinuses are hollow spaces in the bones around your face.  This condition is caused by tissues in your nose that become inflamed or swollen. This traps germs. These can lead to infection.  If you were prescribed an antibiotic medicine, take it as told by your doctor. Do not stop taking it even if you start to feel better.  Keep all follow-up visits as told by your doctor. This is important. This information is not intended to replace advice given to you by your health care provider. Make sure you discuss any questions you have with your health care provider. Document Released: 07/03/2007 Document Revised: 06/16/2017 Document Reviewed: 06/16/2017 Elsevier Interactive Patient Education  2019 [employer]   Earwax Buildup, Adult The ears produce a substance called earwax that helps keep bacteria out of the ear and protects the skin in the ear canal. Occasionally, earwax can build up in the ear and cause discomfort or hearing loss. What increases the risk? This condition is more likely to develop in people who:  Are male.  Are elderly.  Naturally produce more earwax.  Clean their ears often with cotton swabs.  Use earplugs often.  Use in-ear headphones often.  Wear hearing aids.  Have  narrow ear canals.  Have earwax that is overly thick or sticky.  Have eczema.  Are dehydrated.  Have excess hair in the ear canal. What are the signs or symptoms? Symptoms of this condition include:  Reduced or muffled hearing.  A feeling of fullness in the ear or feeling that the ear is plugged.  Fluid coming from the ear.  Ear pain.  Ear itch.  Ringing in the ear.  Coughing.  An obvious piece of earwax that can be seen inside the ear canal. How is this diagnosed? This condition may be diagnosed based on:  Your symptoms.  Your medical history.  An ear exam. During the exam, your health care provider will look into your ear with an instrument called an otoscope. You may have tests, including a hearing test. How is this treated? This condition may be treated by:  Using ear drops  to soften the earwax.  Having the earwax removed by a health care provider. The health care provider may: ? Flush the ear with water. ? Use an instrument that has a loop on the end (curette). ? Use a suction device.  Surgery to remove the wax buildup. This may be done in severe cases. Follow these instructions at home:   Take over-the-counter and prescription medicines only as told by your health care provider.  Do not put any objects, including cotton swabs, into your ear. You can clean the opening of your ear canal with a washcloth or facial tissue.  Follow instructions from your health care provider about cleaning your ears. Do not over-clean your ears.  Drink enough fluid to keep your urine clear or pale yellow. This will help to thin the earwax.  Keep all follow-up visits as told by your health care provider. If earwax builds up in your ears often or if you use hearing aids, consider seeing your health care provider for routine, preventive ear cleanings. Ask your health care provider how often you should schedule your cleanings.  If you have hearing aids, clean them according to  instructions from the manufacturer and your health care provider. Contact a health care provider if:  You have ear pain.  You develop a fever.  You have blood, pus, or other fluid coming from your ear.  You have hearing loss.  You have ringing in your ears that does not go away.  Your symptoms do not improve with treatment.  You feel like the room is spinning (vertigo). Summary  Earwax can build up in the ear and cause discomfort or hearing loss.  The most common symptoms of this condition include reduced or muffled hearing and a feeling of fullness in the ear or feeling that the ear is plugged.  This condition may be diagnosed based on your symptoms, your medical history, and an ear exam.  This condition may be treated by using ear drops to soften the earwax or by having the earwax removed by a health care provider.  Do not put any objects, including cotton swabs, into your ear. You can clean the opening of your ear canal with a washcloth or facial tissue. This information is not intended to replace advice given to you by your health care provider. Make sure you discuss any questions you have with your health care provider. Document Released: 02/22/2004 Document Revised: 12/26/2016 Document Reviewed: 03/27/2016 Elsevier Interactive Patient Education  2019 [employer].

## 2018-02-10 NOTE — Progress Notes (Signed)
Christopher Cobb , 1960/03/06, 58 y.o., male MRN: [id number] Patient Care Team    Relationship Specialty Notifications Start End  McGowen, Maryjean Morn, MD PCP - General Family Medicine  10/22/17   Cherly Anderson, MD Physician Assistant Urology  11/11/17     Chief Complaint  Patient presents with  . Nasal Congestion    x 1 mo  . Shortness of Breath    on and off  . Tinnitus    ear feels stopped up     Subjective: Pt presents for an OV with complaints of nasal congestion of 1 month duration.  Associated symptoms include worsening hoarseness, intermittent wheezing,fatigue and  left ear fullness with decreased hearing, as well as popping of his left ear.  Pt has tried  to ease their symptoms.   Depression screen PHQ 2/9 10/22/2017  Decreased Interest 0  Down, Depressed, Hopeless 0  PHQ - 2 Score 0    No Known Allergies Social History   Tobacco Use  . Smoking status: Never Smoker  . Smokeless tobacco: Never Used  Substance Use Topics  . Alcohol use: Never    Frequency: Never   Past Medical History:  Diagnosis Date  . Atherosclerosis of native artery of extremity (HCC)   . BPH with obstruction/lower urinary tract symptoms    [facility] is following pt and checking annual PSA's (most recent visit with them was 11/2017)  . GERD (gastroesophageal reflux disease)   . Hemorrhoids   . Hydrocele, bilateral    + epididymal cyst--->[facility]  . Hypercholesterolemia   . Hypertension   . Microscopic hematuria 2014   u/s and cystoscopy normal.  No recurrence.  No gross hematuria.  . Prediabetes   . Prostate cancer screening 12/22/2017   PSA done at [facility] 12/22/17 (Dr. Gala Lewandowsky)  . PVD (peripheral vascular disease) (HCC)   . Vitamin D deficiency    Past Surgical History:  Procedure Laterality Date  . COLONOSCOPY  02/05/2008   for rectal bleeding: normal (presumed cause was hemorrhoids). ( [facility]).  Repeat 2018 normal.  . KNEE SURGERY    . SHOULDER SURGERY       Family History  Problem Relation Age of Onset  . Cervical cancer Mother   . Diabetes Mother   . Hyperlipidemia Mother   . Heart disease Father   . Diabetes Sister    Allergies as of 02/10/2018   No Known Allergies     Medication List       Accurate as of February 10, 2018 10:45 AM. Always use your most recent med list.        amLODipine-benazepril 10-20 MG capsule Commonly known as:  LOTREL Take 1 capsule by mouth daily.   hydrochlorothiazide 25 MG tablet Commonly known as:  HYDRODIURIL Take 1 tablet (25 mg total) by mouth daily.   ketoconazole 2 % shampoo Commonly known as:  NIZORAL 1 application three times per week   rosuvastatin 20 MG tablet Commonly known as:  CRESTOR TAKE 1 TABLET BY MOUTH EVERY DAY       All past medical history, surgical history, allergies, family history, immunizations andmedications were updated in the EMR today and reviewed under the history and medication portions of their EMR.     ROS: Negative, with the exception of above mentioned in HPI   Objective:  BP 140/82 (BP Location: Right Arm, Patient Position: Sitting, Cuff Size: Large)   Pulse 77   Temp 98 F (36.7 C) (Oral)   Resp 16  Ht 5\' 10"  (1.778 m)   Wt 237 lb (107.5 kg)   SpO2 100%   BMI 34.01 kg/m  Body mass index is 34.01 kg/m. Gen: Afebrile. No acute distress. Nontoxic in appearance, well developed, well nourished.  HENT: AT. [place]. Right  TM visualized without erythema, fullness, left TM unable to be visualized secondary to cerumen impaction. MMM, no oral lesions. Bilateral nares with swelling , drainage, erythema. Throat without erythema or exudates. PND present. Hoarseness present. No cough.  Eyes:Pupils Equal Round Reactive to light, Extraocular movements intact,  Conjunctiva without redness, discharge or icterus. Neck/lymp/endocrine: Supple, no lymphadenopathy CV: RRR  Chest: CTAB, no wheeze or crackles. Good air movement, normal resp effort.  Neuro:  Normal gait.  PERLA. EOMi. Alert. Oriented x3   No exam data present No results found. No results found for this or any previous visit (from the past 24 hour(s)).  Assessment/Plan: [name] is a 58 y.o. male present for OV for  Hearing loss due to cerumen impaction - cerumen removed today by lavage. Pt tolerated procedure well. Hearing return to normal after lavage. TM with mild erythema, intact, EAC normal.   Acute non-recurrent frontal sinusitis Rest, hydrate.  Start  flonase nasal spray daily as instructed.  Doxycyline  prescribed, take until completed. For sinus infection.  Your left ear had ear wax clogged.  If cough present it can last up to 6-8 weeks.  F/U 2 weeks of not improved.     Reviewed expectations re: course of current medical issues.  Discussed self-management of symptoms.  Outlined signs and symptoms indicating need for more acute intervention.  Patient verbalized understanding and all questions were answered.  Patient received an After-Visit Summary.    No orders of the defined types were placed in this encounter.    Note is dictated utilizing voice recognition software. Although note has been proof read prior to signing, occasional typographical errors still can be missed. If any questions arise, please do not hesitate to call for verification.   electronically signed by:  Felix Pacini, DO  [facility] [place]

## 2018-04-10 ENCOUNTER — Other Ambulatory Visit: Payer: Self-pay | Admitting: Family Medicine

## 2018-04-10 MED ORDER — AMLODIPINE BESY-BENAZEPRIL HCL 10-20 MG PO CAPS: 1.0000 | ORAL_CAPSULE | Freq: Every day | ORAL | 0 refills | Status: DC

## 2018-04-10 NOTE — Telephone Encounter (Signed)
Copied from CRM [id number]. Topic: Quick Communication - Rx Refill/Question >> Apr 10, 2018 11:42 AM Herby Abraham C wrote: Medication: amLODipine-benazepril (LOTREL) 10-20 MG capsule  --- 90 day supply   Has the patient contacted their pharmacy? yes (Agent: If no, request that the patient contact the pharmacy for the refill.) (Agent: If yes, when and what did the pharmacy advise?)  Preferred Pharmacy (with phone number or street name): [id number] [place], [place] - [address] AT [facility] [address] [phone] (Phone) [phone] (Fax)    Agent: Please be advised that RX refills may take up to 3 business days. We ask that you follow-up with your pharmacy.

## 2018-04-21 ENCOUNTER — Encounter: Payer: Self-pay | Admitting: Family Medicine

## 2018-04-21 ENCOUNTER — Ambulatory Visit (INDEPENDENT_AMBULATORY_CARE_PROVIDER_SITE_OTHER): Payer: 59 | Admitting: Family Medicine

## 2018-04-21 ENCOUNTER — Other Ambulatory Visit: Payer: Self-pay

## 2018-04-21 VITALS — BP 137/83 | HR 72

## 2018-04-21 DIAGNOSIS — R52 Pain, unspecified: Secondary | ICD-10-CM | POA: Diagnosis not present

## 2018-04-21 NOTE — Progress Notes (Signed)
OFFICE VISIT  04/21/2018   CC:  Chief Complaint  Patient presents with  . Generalized Body Aches  . Chills   HPI:    Patient is a 58 y.o.  male  with whom I am doing a telephone visit today (due to COVID-19 pandemic restrictions). Prior to proceeding with our visit today, the telephone visit process was explained  and pt gave consent to proceed and understood that this process would be used to assess and treat the patient.  HPI: Onset 3-4 d/a felt chills when he got home from work.  No fatigue. Ok the next day.  Last few days has felt like he just wanted to get into bed when he got home from work. In evenings he feels some body aches, primarily knees.  Despite my attempts to get him to give any specifics he cannot really tell me more than this.   No coughing.  No fevers.  No recent travel. Feeling fine right now. No signif nasal sx's.  No rashes.  Question of some increased urinary frequency and urgency compared to his normal.  ?Hurt to pee on one occasion. No n/v/d.   Past Medical History:  Diagnosis Date  . Atherosclerosis of native artery of extremity (HCC)   . BPH with obstruction/lower urinary tract symptoms    [facility] is following pt and checking annual PSA's (most recent visit with them was 11/2017)  . GERD (gastroesophageal reflux disease)   . Hemorrhoids   . Hydrocele, bilateral    + epididymal cyst--->urol following [facility]).  . Hypercholesterolemia   . Hypertension   . Microscopic hematuria 2014   u/s and cystoscopy normal.  No recurrence.  No gross hematuria.  . Prediabetes   . Prostate cancer screening 12/22/2017   PSA done at [facility] 12/22/17 (Dr. Gala Lewandowsky)  . PVD (peripheral vascular disease) (HCC)   . Right ureteral stone 05/2011  . Vitamin D deficiency     Past Surgical History:  Procedure Laterality Date  . COLONOSCOPY  02/05/2008   for rectal bleeding: normal (presumed cause was hemorrhoids). ([facility]).  Repeat 2018 normal.  . KNEE SURGERY    .  SHOULDER SURGERY      Outpatient Medications Prior to Visit  Medication Sig Dispense Refill  . amLODipine-benazepril (LOTREL) 10-20 MG capsule Take 1 capsule by mouth daily. 90 capsule 0  . hydrochlorothiazide (HYDRODIURIL) 25 MG tablet Take 1 tablet (25 mg total) by mouth daily. 90 tablet 1  . rosuvastatin (CRESTOR) 20 MG tablet TAKE 1 TABLET BY MOUTH EVERY DAY 90 tablet 3  . fluticasone (FLONASE) 50 MCG/ACT nasal spray Place 2 sprays into both nostrils daily. (Patient not taking: Reported on 04/21/2018) 16 g 6  . ketoconazole (NIZORAL) 2 % shampoo 1 application three times per week (Patient not taking: Reported on 04/21/2018) 120 mL 2  . doxycycline (VIBRA-TABS) 100 MG tablet Take 1 tablet (100 mg total) by mouth 2 (two) times daily. (Patient not taking: Reported on 04/21/2018) 20 tablet 0   No facility-administered medications prior to visit.     No Known Allergies  ROS As per HPI  PE: Blood pressure 137/83, pulse 72. Alert, pleasant, lucid thought and speech. No further exam.  LABS:    Chemistry      Component Value Date/Time   NA 142 10/22/2017 0923   NA 146 10/31/2015   K 3.8 10/22/2017 0923   CL 105 10/22/2017 0923   CO2 31 10/22/2017 0923   BUN 13 10/22/2017 0923   BUN  14 10/31/2015   CREATININE 0.99 10/22/2017 0923   GLU 84 10/31/2015      Component Value Date/Time   CALCIUM 9.2 10/22/2017 0923   ALKPHOS 58 10/22/2017 0923   AST 12 10/22/2017 0923   ALT 15 10/22/2017 0923   BILITOT 0.6 10/22/2017 0923     Lab Results  Component Value Date   WBC 3.9 (L) 11/11/2017   HGB 14.3 11/11/2017   HCT 44.2 11/11/2017   MCV 86.9 11/11/2017   PLT 218.0 11/11/2017   Lab Results  Component Value Date   HGBA1C 5.3 11/11/2017   Lab Results  Component Value Date   TSH 0.88 11/11/2017   Lab Results  Component Value Date   CHOL 160 10/22/2017   HDL 46.70 10/22/2017   LDLCALC 103 (H) 10/22/2017   LDLDIRECT 168.0 01/07/2008   TRIG 52.0 10/22/2017   CHOLHDL 3  10/22/2017     IMPRESSION AND PLAN:  Telephone visit:  Vague malaise: describes "chills" w/out fever, with intermittent mild joint aches mainly knees. Question of some mild prostatitis sx's. Feeling fine now per pt. Plan is to have him come in to office tomorrow for further evaluation. Pt agreeable to plan.  Spent 10 min with pt today, with >50% of this time spent in counseling and care coordination regarding the above problems.  An After Visit Summary was printed and given to the patient.  FOLLOW UP: Return in about 1 day (around 04/22/2018) for aches/chills.  Signed:  Santiago Bumpers, MD           04/21/2018

## 2018-04-22 ENCOUNTER — Encounter: Payer: Self-pay | Admitting: Family Medicine

## 2018-04-22 ENCOUNTER — Ambulatory Visit (INDEPENDENT_AMBULATORY_CARE_PROVIDER_SITE_OTHER): Payer: 59 | Admitting: Family Medicine

## 2018-04-22 VITALS — BP 132/80 | HR 86 | Temp 98.3°F | Resp 16 | Ht 70.0 in | Wt 243.4 lb

## 2018-04-22 DIAGNOSIS — R5383 Other fatigue: Secondary | ICD-10-CM | POA: Diagnosis not present

## 2018-04-22 DIAGNOSIS — R5381 Other malaise: Secondary | ICD-10-CM | POA: Diagnosis not present

## 2018-04-22 DIAGNOSIS — R590 Localized enlarged lymph nodes: Secondary | ICD-10-CM

## 2018-04-22 NOTE — Progress Notes (Signed)
OFFICE VISIT  04/22/2018   CC:  Chief Complaint  Patient presents with  . Generalized Body Aches  . Chills     HPI:    Patient is a 58 y.o.  male originally from [place] who presents to the office unaccompanied today for a vague sense of "not feeling like himself" the last 4 days or so. Started after work 4 d/a, got home and felt "chills" but had no fever, felt a bit achy in random, roving spots and just wanted to get in bed and go to sleep.  Mild HA.  Very little sniffles, no ST, no cough or wheeze.  No temp checked the first 2d, then temp checked 3rd day and it was 97.8.   Seems to feel pretty normal during daytime, then sx's return when he gets home from work. Interim Hx: Feeling much betteryesterday and even better today. No urinary sx's.   Taking theraflu. No nausea, no abd pain.  No melena or diarrhea.  No known recent sick contacts. No rash.   Not suffering from any anxiety or depression. No potential exposure to carbon monoxide.  He does not snore.  No hx of witnessed apneic spells.   No excessive daytime sleepiness.  Past Medical History:  Diagnosis Date  . Atherosclerosis of native artery of extremity (HCC)   . BPH with obstruction/lower urinary tract symptoms    [facility] is following pt and checking annual PSA's (most recent visit with them was 11/2017)  . GERD (gastroesophageal reflux disease)   . Hemorrhoids   . Hydrocele, bilateral    + epididymal cyst--->urol following [facility]).  . Hypercholesterolemia   . Hypertension   . Microscopic hematuria 2014   u/s and cystoscopy normal.  No recurrence.  No gross hematuria.  . Prediabetes   . Prostate cancer screening 12/22/2017   PSA done at [facility] 12/22/17 (Dr. Gala Lewandowsky)  . PVD (peripheral vascular disease) (HCC)   . Right ureteral stone 05/2011  . Vitamin D deficiency     Past Surgical History:  Procedure Laterality Date  . COLONOSCOPY  02/05/2008   for rectal bleeding: normal (presumed cause was  hemorrhoids). ([facility]).  Repeat 2018 normal.  . KNEE SURGERY    . SHOULDER SURGERY      Outpatient Medications Prior to Visit  Medication Sig Dispense Refill  . amLODipine-benazepril (LOTREL) 10-20 MG capsule Take 1 capsule by mouth daily. 90 capsule 0  . hydrochlorothiazide (HYDRODIURIL) 25 MG tablet Take 1 tablet (25 mg total) by mouth daily. 90 tablet 1  . ketoconazole (NIZORAL) 2 % shampoo 1 application three times per week 120 mL 2  . rosuvastatin (CRESTOR) 20 MG tablet TAKE 1 TABLET BY MOUTH EVERY DAY 90 tablet 3  . fluticasone (FLONASE) 50 MCG/ACT nasal spray Place 2 sprays into both nostrils daily. (Patient not taking: Reported on 04/22/2018) 16 g 6   No facility-administered medications prior to visit.     No Known Allergies  ROS As per HPI  PE: Blood pressure 132/80, pulse 86, temperature 98.3 F (36.8 C), temperature source Oral, resp. rate 16, height 5\' 10"  (1.778 m), weight 243 lb 6.4 oz (110.4 kg), SpO2 99 %. Gen: Alert, well appearing.  Patient is oriented to person, place, time, and situation. AFFECT: pleasant, lucid thought and speech. ENT: Ears: EACs clear, normal epithelium.  TMs with good light reflex and landmarks bilaterally.  Eyes: no injection, icteris, swelling, or exudate.  EOMI, PERRLA. Nose: no drainage or turbinate edema/swelling.  No injection or  focal lesion.  Mouth: lips without lesion/swelling.  Oral mucosa pink and moist.  Dentition intact and without obvious caries or gingival swelling.  Oropharynx without erythema, exudate, or swelling.  Neck: he has a 1 cm firm and moveable, mildly tendern LN in R submandibular region, a similar feeling 2cm node in L paratracheal region, and a bee bee sized one directly posterior to the one on the left. His scalp has scattered small ovals of no hair, w/out flaking or erythema or wound.  There are several dozen of these, each about 2-4 mm in size. ABD: soft NT, ND EXT: no clubbing or cyanosis.  no edema.  Axillae  and groin: no LAD.   LABS:    Chemistry      Component Value Date/Time   NA 142 10/22/2017 0923   NA 146 10/31/2015   K 3.8 10/22/2017 0923   CL 105 10/22/2017 0923   CO2 31 10/22/2017 0923   BUN 13 10/22/2017 0923   BUN 14 10/31/2015   CREATININE 0.99 10/22/2017 0923   GLU 84 10/31/2015      Component Value Date/Time   CALCIUM 9.2 10/22/2017 0923   ALKPHOS 58 10/22/2017 0923   AST 12 10/22/2017 0923   ALT 15 10/22/2017 0923   BILITOT 0.6 10/22/2017 0923     Lab Results  Component Value Date   WBC 3.9 (L) 11/11/2017   HGB 14.3 11/11/2017   HCT 44.2 11/11/2017   MCV 86.9 11/11/2017   PLT 218.0 11/11/2017   Lab Results  Component Value Date   TSH 0.88 11/11/2017    IMPRESSION AND PLAN:  Viral syndrome characterized by mild malaise, chills, roving mild achiness. Seems to be resolved at this time.  Reassured pt at this time. With his small cervical LN's, these are likely reactive, possibly secondary to his scalp abnormality of unknown etiology. Scalp scraping in the past at derm was neg for fungal elements per pt report today. We'll keep any eye on these nodes--measured them today (see exam) and we'll see how they feel at f/u for [facility]CI in 6 wks. No labs or imaging indicated at this time. Signs/symptoms to call or return for were reviewed and pt expressed understanding.  An After Visit Summary was printed and given to the patient.  FOLLOW UP: Return in about 6 weeks (around 06/03/2018) for 30 min f/u chronic illness + cervical lymphadenopathy.  Signed:  Santiago Bumpers, MD           04/22/2018

## 2018-05-05 ENCOUNTER — Telehealth: Payer: Self-pay | Admitting: Family Medicine

## 2018-05-05 NOTE — Telephone Encounter (Signed)
Called patient to change 05/12/18 visit to virtual visit. Patient mentioned that it is a follow up visit for the lymph nodes in his neck. Patient wanted to know if it was appropriate to do it as a virtual visit or should he wait until we are seeing patients in the office. I advised patient it may be a month before we are scheduling visits in the office. Patient wanted to check with Dr. Milinda Cave to see what he suggests.

## 2018-05-05 NOTE — Telephone Encounter (Signed)
Pt was to F/U from 3/25 appt in 6 weeks for chronic illness + cervical lymphadenopathy  Please advise how you would like to proceed (virtual visit, in person, delay F/U, etc)

## 2018-05-06 NOTE — Telephone Encounter (Signed)
Pt was called and had no complaints or concerns that he needed to be seen sooner. Pt was scheduled for Doxy me visit.

## 2018-05-06 NOTE — Telephone Encounter (Signed)
When I saw him on 3/25 I wanted to see him back in 6 weeks for recheckll>it has only been 2 weeks. Is he getting worse? If not getting worse, then schedule in-office f/u for 1 month from now. Let me know-thx

## 2018-05-12 ENCOUNTER — Ambulatory Visit: Payer: 59 | Admitting: Family Medicine

## 2018-06-02 ENCOUNTER — Ambulatory Visit (INDEPENDENT_AMBULATORY_CARE_PROVIDER_SITE_OTHER): Payer: 59 | Admitting: Family Medicine

## 2018-06-02 ENCOUNTER — Encounter: Payer: Self-pay | Admitting: Family Medicine

## 2018-06-02 ENCOUNTER — Other Ambulatory Visit: Payer: Self-pay

## 2018-06-02 VITALS — BP 134/88 | HR 82

## 2018-06-02 DIAGNOSIS — R590 Localized enlarged lymph nodes: Secondary | ICD-10-CM | POA: Diagnosis not present

## 2018-06-02 DIAGNOSIS — I10 Essential (primary) hypertension: Secondary | ICD-10-CM | POA: Diagnosis not present

## 2018-06-02 DIAGNOSIS — B349 Viral infection, unspecified: Secondary | ICD-10-CM | POA: Diagnosis not present

## 2018-06-02 DIAGNOSIS — E7800 Pure hypercholesterolemia, unspecified: Secondary | ICD-10-CM | POA: Diagnosis not present

## 2018-06-02 MED ORDER — METOPROLOL SUCCINATE ER 25 MG PO TB24: 25.0000 mg | ORAL_TABLET | Freq: Every day | ORAL | 0 refills | Status: DC

## 2018-06-02 NOTE — Progress Notes (Signed)
Virtual Visit via Video Note  I connected with pt on 06/02/18 at 10:40 AM EDT by a video enabled telemedicine application and verified that I am speaking with the correct person using two identifiers.  Location patient: home Location provider:work or home office Persons participating in the virtual visit: patient, provider  I discussed the limitations of evaluation and management by telemedicine and the availability of in person appointments. The patient expressed understanding and agreed to proceed.  Telemedicine visit is a necessity given the COVID-19 restrictions in place at the current time.  HPI: 58 y/o male being seen for 6 wk f/u suspected viral syndrome (malaise, chills w/out fever, roving mild achiness and mild HA) with some submand/cerv lymphadenopathy that I suspected to be reactive (from chronic scalp rash? Or with the viral syndrome?). He was actually feeling signif improved at the time I saw him in office for this problem, and we ultimately did no testing and no treatment other than otc symptomatic care. I measured his LN's and wanted to recheck these by examining them today but he was put on the schedule as virtual visit.  Interim hx: Feeling very good.  No fevers, chills, night sweats, or wt loss. He denies any symptoms whatsoever-->"I feel really good". Says he cannot feel any swollen glands around neck or jaws.  Energy level is good, appetite good.  HTN: not monitoring bp with any regularity. He was getting ED while on hctz so he stopped this med and his ED resolved. No bp checks since stopping hctz (except for today-see below).  HLD: tolerating statin fine.    ROS: See pertinent positives and negatives per HPI.  Past Medical History:  Diagnosis Date  . Atherosclerosis of native artery of extremity (HCC)   . BPH with obstruction/lower urinary tract symptoms    [facility] is following pt and checking annual PSA's (most recent visit with them was 11/2017)  . GERD  (gastroesophageal reflux disease)   . Hemorrhoids   . Hydrocele, bilateral    + epididymal cyst--->urol following [facility]).  . Hypercholesterolemia   . Hypertension   . Microscopic hematuria 2014   u/s and cystoscopy normal.  No recurrence.  No gross hematuria.  . Prediabetes   . Prostate cancer screening 12/22/2017   PSA done at [facility] 12/22/17 (Dr. Gala LewandowskyBadlani)  . PVD (peripheral vascular disease) (HCC)   . Right ureteral stone 05/2011  . Vitamin D deficiency     Past Surgical History:  Procedure Laterality Date  . COLONOSCOPY  02/05/2008   for rectal bleeding: normal (presumed cause was hemorrhoids). ([facility]).  Repeat 2018 normal.  . KNEE SURGERY    . SHOULDER SURGERY      Family History  Problem Relation Age of Onset  . Cervical cancer Mother   . Diabetes Mother   . Hyperlipidemia Mother   . Heart disease Father   . Diabetes Sister      Current Outpatient Medications:  .  amLODipine-benazepril (LOTREL) 10-20 MG capsule, Take 1 capsule by mouth daily., Disp: 90 capsule, Rfl: 0 .  rosuvastatin (CRESTOR) 20 MG tablet, TAKE 1 TABLET BY MOUTH EVERY DAY, Disp: 90 tablet, Rfl: 3 .  fluticasone (FLONASE) 50 MCG/ACT nasal spray, Place 2 sprays into both nostrils daily. (Patient not taking: Reported on 04/22/2018), Disp: 16 g, Rfl: 6 .  ketoconazole (NIZORAL) 2 % shampoo, 1 application three times per week (Patient not taking: Reported on 06/02/2018), Disp: 120 mL, Rfl: 2 .  metoprolol succinate (TOPROL-XL) 25 MG 24 hr  tablet, Take 1 tablet (25 mg total) by mouth daily., Disp: 30 tablet, Rfl: 0  EXAM:  VITALS per patient if applicable: BP 134/88 (BP Location: Left Arm, Patient Position: Sitting, Cuff Size: Normal)   Pulse 82    GENERAL: alert, oriented, appears well and in no acute distress  HEENT: atraumatic, conjunttiva clear, no obvious abnormalities on inspection of external nose and ears  NECK: normal movements of the head and neck  LUNGS: on inspection no signs of  respiratory distress, breathing rate appears normal, no obvious gross SOB, gasping or wheezing  CV: no obvious cyanosis  MS: moves all visible extremities without noticeable abnormality  PSYCH/NEURO: pleasant and cooperative, no obvious depression or anxiety, speech and thought processing grossly intact  LABS: none today    Chemistry      Component Value Date/Time   NA 142 10/22/2017 0923   NA 146 10/31/2015   K 3.8 10/22/2017 0923   CL 105 10/22/2017 0923   CO2 31 10/22/2017 0923   BUN 13 10/22/2017 0923   BUN 14 10/31/2015   CREATININE 0.99 10/22/2017 0923   GLU 84 10/31/2015      Component Value Date/Time   CALCIUM 9.2 10/22/2017 0923   ALKPHOS 58 10/22/2017 0923   AST 12 10/22/2017 0923   ALT 15 10/22/2017 0923   BILITOT 0.6 10/22/2017 0923     Lab Results  Component Value Date   HGBA1C 5.3 11/11/2017   Lab Results  Component Value Date   CHOL 160 10/22/2017   HDL 46.70 10/22/2017   LDLCALC 103 (H) 10/22/2017   LDLDIRECT 168.0 01/07/2008   TRIG 52.0 10/22/2017   CHOLHDL 3 10/22/2017    ASSESSMENT AND PLAN:  Discussed the following assessment and plan:  1) Viral syndrome + reactive neck lymph nodes. Pt has been feeling great since last visit. Signs/symptoms to call or return for were reviewed and pt expressed understanding. Will recheck nodes in neck at next f/u, earlier if he develops new sx's OR if he feels swelling in neck area or other areas.  2) HTN: not ideal control. HCTZ caused ED. Will start toprol xl 25mg , 1 qd, have him check bp and hr daily and f/u to review in 2 wks.  3) HLD: tolerating statin well. Plan recheck lipids at next cpe in 25mo.    I discussed the assessment and treatment plan with the patient. The patient was provided an opportunity to ask questions and all were answered. The patient agreed with the plan and demonstrated an understanding of the instructions.   The patient was advised to call back or seek an in-person  evaluation if the symptoms worsen or if the condition fails to improve as anticipated.  F/u:  2 wks htn 6 mo cpe  Signed:  Santiago Bumpers, MD           06/02/2018

## 2018-06-16 ENCOUNTER — Ambulatory Visit (INDEPENDENT_AMBULATORY_CARE_PROVIDER_SITE_OTHER): Payer: 59 | Admitting: Family Medicine

## 2018-06-16 ENCOUNTER — Other Ambulatory Visit: Payer: Self-pay

## 2018-06-16 ENCOUNTER — Encounter: Payer: Self-pay | Admitting: Family Medicine

## 2018-06-16 VITALS — BP 134/84 | HR 68

## 2018-06-16 DIAGNOSIS — M79661 Pain in right lower leg: Secondary | ICD-10-CM | POA: Diagnosis not present

## 2018-06-16 DIAGNOSIS — I10 Essential (primary) hypertension: Secondary | ICD-10-CM | POA: Diagnosis not present

## 2018-06-16 DIAGNOSIS — M79662 Pain in left lower leg: Secondary | ICD-10-CM | POA: Diagnosis not present

## 2018-06-16 NOTE — Progress Notes (Signed)
Virtual Visit via Video Note  I connected with pt on 06/16/18 at 10:40 AM EDT by a video enabled telemedicine application and verified that I am speaking with the correct person using two identifiers.  Location patient: home Location provider:work or home office Persons participating in the virtual visit: patient, provider  I discussed the limitations of evaluation and management by telemedicine and the availability of in person appointments. The patient expressed understanding and agreed to proceed.  Telemedicine visit is a necessity given the COVID-19 restrictions in place at the current time.  HPI: 58 y/o male being seen today for 2 wk f/u uncontrolled HTN. HCTZ had caused him ED so he self d/c'd this. Last visit I replaced the hctz with toprol xl 25mg  qd. He reports feeling well, no probs with toprol. BP: lowest 117/79, highest 134/94.  Avg around 130 syst, 80 syst. HR range 54-78.  Asks about checking circulation in legs.  Question of accuracy of his hx, but seems to have had PAD dx in the remote past, which he says got better after getting on statin.  He does not recall what sx's he had at the time of the PAD testing. He currently reports some mild intermittent pain in both LL's and it is always when he is sitting/standing but gets better as he begins to walk around. Unclear whether or not he has a sensation of restlessness in legs.  No LL's swelling and no color changes noted.  No skin changes noted.  ROS: no CP, no SOB, no wheezing, no cough, no dizziness, no HAs, no rashes, no melena/hematochezia.  No polyuria or polydipsia.     Past Medical History:  Diagnosis Date  . Atherosclerosis of native artery of extremity (HCC)   . BPH with obstruction/lower urinary tract symptoms    [facility] is following pt and checking annual PSA's (most recent visit with them was 11/2017)  . GERD (gastroesophageal reflux disease)   . Hemorrhoids   . Hydrocele, bilateral    + epididymal  cyst--->[facility]rol following [facility]).  . Hypercholesterolemia   . Hypertension   . Microscopic hematuria 2014   u/s and cystoscopy normal.  No recurrence.  No gross hematuria.  . Prediabetes   . Prostate cancer screening 12/22/2017   PSA done at [facility] 12/22/17 (Dr. Gala LewandowskyBadlani)  . PVD (peripheral vascular disease) (HCC)   . Right ureteral stone 05/2011  . Vitamin D deficiency     Past Surgical History:  Procedure Laterality Date  . COLONOSCOPY  02/05/2008   for rectal bleeding: normal (presumed cause was hemorrhoids). ([facility]).  Repeat 2018 normal.  . KNEE SURGERY    . SHOULDER SURGERY      Family History  Problem Relation Age of Onset  . Cervical cancer Mother   . Diabetes Mother   . Hyperlipidemia Mother   . Heart disease Father   . Diabetes Sister     SOCIAL HX: Married, 3 children, [place], Regulatory affairs officersales advisor for [employer], no T/A/Ds.   Current Outpatient Medications:  .  amLODipine-benazepril (LOTREL) 10-20 MG capsule, Take 1 capsule by mouth daily., Disp: 90 capsule, Rfl: 0 .  metoprolol succinate (TOPROL-XL) 25 MG 24 hr tablet, Take 1 tablet (25 mg total) by mouth daily., Disp: 30 tablet, Rfl: 0 .  rosuvastatin (CRESTOR) 20 MG tablet, TAKE 1 TABLET BY MOUTH EVERY DAY, Disp: 90 tablet, Rfl: 3 .  fluticasone (FLONASE) 50 MCG/ACT nasal spray, Place 2 sprays into both nostrils daily. (Patient not taking: Reported  on 04/22/2018), Disp: 16 g, Rfl: 6 .  ketoconazole (NIZORAL) 2 % shampoo, 1 application three times per week (Patient not taking: Reported on 06/02/2018), Disp: 120 mL, Rfl: 2  EXAM:  VITALS per patient if applicable: BP 134/84 (BP Location: Left Arm, Patient Position: Sitting, Cuff Size: Large)   Pulse 68    GENERAL: alert, oriented, appears well and in no acute distress  HEENT: atraumatic, conjunttiva clear, no obvious abnormalities on inspection of external nose and ears  NECK: normal movements of the head and neck  LUNGS: on inspection no  signs of respiratory distress, breathing rate appears normal, no obvious gross SOB, gasping or wheezing  CV: no obvious cyanosis  MS: moves all visible extremities without noticeable abnormality  PSYCH/NEURO: pleasant and cooperative, no obvious depression or anxiety, speech and thought processing grossly intact  LABS: none today    Chemistry      Component Value Date/Time   NA 142 10/22/2017 0923   NA 146 10/31/2015   K 3.8 10/22/2017 0923   CL 105 10/22/2017 0923   CO2 31 10/22/2017 0923   BUN 13 10/22/2017 0923   BUN 14 10/31/2015   CREATININE 0.99 10/22/2017 0923   GLU 84 10/31/2015      Component Value Date/Time   CALCIUM 9.2 10/22/2017 0923   ALKPHOS 58 10/22/2017 0923   AST 12 10/22/2017 0923   ALT 15 10/22/2017 0923   BILITOT 0.6 10/22/2017 0923      ASSESSMENT AND PLAN:  Discussed the following assessment and plan:  1) HTN: The current medical regimen is effective;  continue present plan and medications. BMET-- future.  2) Lower legs pain (?in calves, ? Restless legs sx's??).  Per pt has past dx of PAD but sx's currently reported do not fit with PAD at all.  Question of RLS.  At any rate, no new w/u or treatments today. Will discuss this again and examine pt at next f/u in 4 mo for CPE.   I discussed the assessment and treatment plan with the patient. The patient was provided an opportunity to ask questions and all were answered. The patient agreed with the plan and demonstrated an understanding of the instructions.   The patient was advised to call back or seek an in-person evaluation if the symptoms worsen or if the condition fails to improve as anticipated.  F/u: 4 mo CPE  Signed:  Santiago Bumpers, MD           06/16/2018

## 2018-06-30 ENCOUNTER — Other Ambulatory Visit: Payer: Self-pay | Admitting: Family Medicine

## 2018-07-10 ENCOUNTER — Other Ambulatory Visit: Payer: Self-pay | Admitting: Family Medicine

## 2018-07-22 ENCOUNTER — Emergency Department (HOSPITAL_BASED_OUTPATIENT_CLINIC_OR_DEPARTMENT_OTHER)
Admission: EM | Admit: 2018-07-22 | Discharge: 2018-07-22 | Disposition: A | Discharge status: Home / Self Care | Payer: 59 | Attending: Emergency Medicine | Admitting: Emergency Medicine

## 2018-07-22 ENCOUNTER — Telehealth: Payer: Self-pay | Admitting: Family Medicine

## 2018-07-22 ENCOUNTER — Encounter (HOSPITAL_BASED_OUTPATIENT_CLINIC_OR_DEPARTMENT_OTHER): Payer: Self-pay

## 2018-07-22 ENCOUNTER — Other Ambulatory Visit: Payer: Self-pay

## 2018-07-22 DIAGNOSIS — I10 Essential (primary) hypertension: Secondary | ICD-10-CM | POA: Diagnosis not present

## 2018-07-22 DIAGNOSIS — M5431 Sciatica, right side: Secondary | ICD-10-CM | POA: Diagnosis not present

## 2018-07-22 DIAGNOSIS — I2510 Atherosclerotic heart disease of native coronary artery without angina pectoris: Secondary | ICD-10-CM | POA: Diagnosis not present

## 2018-07-22 DIAGNOSIS — Z79899 Other long term (current) drug therapy: Secondary | ICD-10-CM | POA: Insufficient documentation

## 2018-07-22 DIAGNOSIS — M545 Low back pain: Secondary | ICD-10-CM | POA: Diagnosis present

## 2018-07-22 MED ORDER — KETOROLAC TROMETHAMINE 60 MG/2ML IM SOLN
60.0000 mg | Freq: Once | INTRAMUSCULAR | Status: AC
  Administered 2018-07-22: 60 mg via INTRAMUSCULAR
  Filled 2018-07-22: qty 2

## 2018-07-22 MED ORDER — CYCLOBENZAPRINE HCL 10 MG PO TABS: 10.0000 mg | ORAL_TABLET | Freq: Two times a day (BID) | ORAL | 0 refills | Status: DC | PRN

## 2018-07-22 NOTE — Telephone Encounter (Signed)
Pt calling in to state that he is experiencing sharp muscle pain in his legs and calves that is unbearable. Pt states that his wife called earlier today and he was waiting for a return call. Pt advised of notes and recommendations of Dr. Anitra Lauth on 07/22/18. Pt states that he has to do something about the pain tonight. Pt advised to seek treatment in the ED due to complaint of unbearable pain. Pt verbalized understanding.

## 2018-07-22 NOTE — Telephone Encounter (Signed)
No openings available for today or tomorrow. 4:15 slot currently available on Friday for virtual visit. Last OV, 06/16/18 for uncontrolled HTN. Advised to f/u 4 months CPE.  Please advise, thanks.

## 2018-07-22 NOTE — ED Triage Notes (Signed)
Pt c/o pain to right buttock down right LE-started last night-denies injury-to triage in w/c

## 2018-07-22 NOTE — ED Provider Notes (Signed)
[facility] EMERGENCY DEPARTMENT Provider Note   CSN: [id number] Arrival date & time: 07/22/18  1759     History   Chief Complaint Chief Complaint  Patient presents with   Leg Pain    HPI Christopher Cobb is a 58 y.o. male.     The history is provided by the patient.  Leg Pain Location:  Buttock Injury: no   Buttock location:  R buttock Pain details:    Quality:  Aching, dull and shooting   Radiates to:  Does not radiate   Severity:  Mild   Onset quality:  Gradual   Timing:  Intermittent   Progression:  Waxing and waning Relieved by:  Acetaminophen Worsened by:  Activity Associated symptoms: stiffness   Associated symptoms: no back pain, no decreased ROM, no fatigue, no fever, no itching, no muscle weakness, no neck pain, no numbness, no swelling and no tingling     Past Medical History:  Diagnosis Date   Atherosclerosis of native artery of extremity (HCC)    BPH with obstruction/lower urinary tract symptoms    [facility] is following pt and checking annual PSA's (most recent visit with them was 11/2017)   GERD (gastroesophageal reflux disease)    Hemorrhoids    Hydrocele, bilateral    + epididymal cyst--->urol following ([facility]).   Hypercholesterolemia    Hypertension    Microscopic hematuria 2014   u/s and cystoscopy normal.  No recurrence.  No gross hematuria.   Prediabetes    Prostate cancer screening 12/22/2017   PSA done at [facility] 12/22/17 (Dr. Gala LewandowskyBadlani)   PVD (peripheral vascular disease) [facility](HCC)    Right ureteral stone 05/2011   Vitamin D deficiency     Patient Active Problem List   Diagnosis Date Noted   Hematuria 07/29/2012   Cyst of tunica albuginea testis 09/16/2011   Increased frequency of urination 09/16/2011   ABDOMINAL BLOATING 04/15/2008   HYPOKALEMIA 02/01/2008   HYPERGLYCEMIA 02/01/2008   ESSENTIAL HYPERTENSION 01/07/2008   GERD 01/07/2008   Benign prostatic hyperplasia with lower urinary tract  symptoms 01/07/2008   ELECTROCARDIOGRAM, ABNORMAL 01/07/2008    Past Surgical History:  Procedure Laterality Date   COLONOSCOPY  02/05/2008   for rectal bleeding: normal (presumed cause was hemorrhoids). ([facility]).  Repeat 2018 normal.   KNEE SURGERY     SHOULDER SURGERY          Home Medications    Prior to Admission medications   Medication Sig Start Date End Date Taking? Authorizing Provider  amLODipine-benazepril (LOTREL) 10-20 MG capsule TAKE ONE CAPSULE BY MOUTH DAILY 07/10/18   McGowen, Maryjean MornPhilip H, MD  cyclobenzaprine (FLEXERIL) 10 MG tablet Take 1 tablet (10 mg total) by mouth 2 (two) times daily as needed for up to 15 doses for muscle spasms. 07/22/18   Kaneesha Constantino, DO  fluticasone (FLONASE) 50 MCG/ACT nasal spray Place 2 sprays into both nostrils daily. Patient not taking: Reported on 04/22/2018 02/10/18   Felix PaciniKuneff, Renee A, DO  ketoconazole (NIZORAL) 2 % shampoo 1 application three times per week Patient not taking: Reported on 06/02/2018 11/11/17   Jeoffrey MassedMcGowen, Philip H, MD  metoprolol succinate (TOPROL-XL) 25 MG 24 hr tablet TAKE 1 TABLET BY MOUTH DAILY 06/30/18   McGowen, Maryjean MornPhilip H, MD  rosuvastatin (CRESTOR) 20 MG tablet TAKE 1 TABLET BY MOUTH EVERY DAY 12/09/17   McGowen, Maryjean MornPhilip H, MD    Family History Family History  Problem Relation Age of Onset   Cervical cancer Mother    Diabetes  Mother    Hyperlipidemia Mother    Heart disease Father    Diabetes Sister     Social History Social History   Tobacco Use   Smoking status: Never Smoker   Smokeless tobacco: Never Used  Substance Use Topics   Alcohol use: Never    Frequency: Never   Drug use: Never     Allergies   Hctz [hydrochlorothiazide]   Review of Systems Review of Systems  Constitutional: Negative for chills, fatigue and fever.  HENT: Negative for ear pain and sore throat.   Eyes: Negative for pain and visual disturbance.  Respiratory: Negative for cough and shortness of breath.     Cardiovascular: Negative for chest pain and palpitations.  Gastrointestinal: Negative for abdominal pain and vomiting.  Genitourinary: Negative for dysuria and hematuria.  Musculoskeletal: Positive for gait problem and stiffness. Negative for arthralgias, back pain and neck pain.  Skin: Negative for color change, itching and rash.  Neurological: Negative for seizures and syncope.  All other systems reviewed and are negative.    Physical Exam Updated Vital Signs  ED Triage Vitals  Enc Vitals Group     BP 07/22/18 1814 (!) 160/91     Pulse Rate 07/22/18 1814 70     Resp 07/22/18 1814 18     Temp 07/22/18 1814 98.3 F (36.8 C)     Temp Source 07/22/18 1814 Oral     SpO2 07/22/18 1814 100 %     Weight 07/22/18 1813 239 lb (108.4 kg)     Height 07/22/18 1813 5\' 11"  (1.803 m)     Head Circumference --      Peak Flow --      Pain Score 07/22/18 1811 8     Pain Loc --      Pain Edu? --      Excl. in [place]? --     Physical Exam Vitals signs and nursing note reviewed.  Constitutional:      Appearance: He is well-developed.  HENT:     Head: Normocephalic and atraumatic.  Eyes:     Conjunctiva/sclera: Conjunctivae normal.  Neck:     Musculoskeletal: Neck supple.  Cardiovascular:     Rate and Rhythm: Normal rate and regular rhythm.     Pulses: Normal pulses.     Heart sounds: No murmur.  Pulmonary:     Effort: Pulmonary effort is normal. No respiratory distress.     Breath sounds: Normal breath sounds.  Abdominal:     Palpations: Abdomen is soft.     Tenderness: There is no abdominal tenderness.  Musculoskeletal: Normal range of motion.        General: Tenderness (TTP to right gluteal area) present.     Comments: No midline spinal pain   Skin:    General: Skin is warm and dry.  Neurological:     General: No focal deficit present.     Mental Status: He is alert.     Sensory: No sensory deficit.     Motor: No weakness.     Comments: 5+ out of 5 strength throughout,  normal sensation      ED Treatments / Results  Labs (all labs ordered are listed, but only abnormal results are displayed) Labs Reviewed - No data to display  EKG    Radiology No results found.  Procedures Procedures (including critical care time)  Medications Ordered in ED Medications  ketorolac (TORADOL) injection 60 mg (has no administration in time range)  Initial Impression / Assessment and Plan / ED Course  I have reviewed the triage vital signs and the nursing notes.  Pertinent labs & imaging results that were available during my care of the patient were reviewed by me and considered in my medical decision making (see chart for details).     Christopher Cobb is a 58 year old male history of hypertension who presents to the ED with right buttock pain.  Patient with normal vitals.  No fever.  Pain started after he worked on cutting down some tree branches yesterday in his yard.  Patient states that pain slightly worse as he was walking today.  He feels as if he has a spasm in his right buttock region.  No back pain.  No midline spinal pain on exam.  Reproducible pain in the right buttock area consistent with sciatic type pain.  States that pain shoots into his knee.  Patient neurovascularly and neuromuscularly intact.  No midline spinal tenderness.  No red flag signs for cauda equina.  Given Toradol injection.  Recommend Tylenol, Motrin, Flexeril.  Discharged in good condition.  Given return precautions.  This chart was dictated using voice recognition software.  Despite best efforts to proofread,  errors can occur which can change the documentation meaning.    Final Clinical Impressions(s) / ED Diagnoses   Final diagnoses:  Sciatica of right side    ED Discharge Orders         Ordered    cyclobenzaprine (FLEXERIL) 10 MG tablet  2 times daily PRN     07/22/18 1833           Virgina NorfolkCuratolo, Evian Derringer, DO 07/22/18 1837

## 2018-07-22 NOTE — Telephone Encounter (Signed)
Transferred call from [facility]. Wife of patient. Verified PHI. Requesting to see PCP today as patient has hurt his back, most likely from pulling limbs and cutting down trees in their yard yesterday. Wife states that she have patient Aleeve overnight and massaged area.   He is at work today and can hardly move.  I gave her an option of a virtual visit. At this no in office appts available. She declined virtual visit.  Please advise.  Contact patient or wife at [phone].   Thank you

## 2018-07-22 NOTE — Telephone Encounter (Signed)
Pt can have next available in-office appointment.   I recommend to continue exactly what he is doing: take 2 over the counter aleve tabs with food twice per day, apply heat to the areas of pain, do gentle low back and hamstring stretching.  -thx

## 2018-07-23 NOTE — Telephone Encounter (Signed)
FYI

## 2018-07-23 NOTE — Telephone Encounter (Signed)
He can have my next open office appt---not a work in.-thx

## 2018-07-23 NOTE — Telephone Encounter (Signed)
Called patient back and offered virtual visit this afternoon or tomorrow afternoon. Patient is going to call [facility] to see if he can get in with them sooner. Patient will call back if he cannot get an appointment this week or early next week and schedule an in office visit.

## 2018-07-23 NOTE — Telephone Encounter (Signed)
Patient's wife is calling requesting an appointment today for leg pain. Patient wend to ED yesterday. There are no openings on the schedule. Please advise.

## 2018-07-23 NOTE — Telephone Encounter (Signed)
Noted  

## 2018-07-23 NOTE — Telephone Encounter (Signed)
Pt was given Toradol injection and Flexeril prescription at ED. Diagnosis at discharge was sciatica of right side. Pt is still requesting appt. No openings until Monday.  Please advise, thanks.

## 2018-07-23 NOTE — Telephone Encounter (Signed)
Please assist patient with scheduling

## 2018-11-09 ENCOUNTER — Other Ambulatory Visit: Payer: Self-pay

## 2018-11-09 MED ORDER — AMLODIPINE BESY-BENAZEPRIL HCL 10-20 MG PO CAPS: 1.0000 | ORAL_CAPSULE | Freq: Every day | ORAL | 0 refills | Status: DC

## 2018-11-30 ENCOUNTER — Other Ambulatory Visit: Payer: Self-pay

## 2018-11-30 ENCOUNTER — Encounter: Payer: Self-pay | Admitting: Family Medicine

## 2018-11-30 ENCOUNTER — Ambulatory Visit (INDEPENDENT_AMBULATORY_CARE_PROVIDER_SITE_OTHER): Payer: 59 | Admitting: Family Medicine

## 2018-11-30 VITALS — BP 121/76 | HR 84 | Temp 98.7°F | Resp 16 | Ht 70.0 in | Wt 234.0 lb

## 2018-11-30 DIAGNOSIS — Z23 Encounter for immunization: Secondary | ICD-10-CM

## 2018-11-30 DIAGNOSIS — I10 Essential (primary) hypertension: Secondary | ICD-10-CM

## 2018-11-30 DIAGNOSIS — E7800 Pure hypercholesterolemia, unspecified: Secondary | ICD-10-CM

## 2018-11-30 DIAGNOSIS — Z0000 Encounter for general adult medical examination without abnormal findings: Secondary | ICD-10-CM | POA: Diagnosis not present

## 2018-11-30 DIAGNOSIS — I739 Peripheral vascular disease, unspecified: Secondary | ICD-10-CM

## 2018-11-30 DIAGNOSIS — R7303 Prediabetes: Secondary | ICD-10-CM

## 2018-11-30 NOTE — Patient Instructions (Signed)

## 2018-11-30 NOTE — Progress Notes (Signed)
Office Note 11/30/2018  CC:  Chief Complaint  Patient presents with  . Annual Exam    pt is not fasting    HPI:  Christopher Cobb is a 58 y.o. male who is here for annual health maintenance exam.  BP: occ home monitoring-> avg 129/84.  Not taking metoprolol due to confusion about instructions.  Diet: trying to eat healthy, more salads. Exercise: walks 1 hr three times per week.  Past Medical History:  Diagnosis Date  . Atherosclerosis of native artery of extremity (HCC)   . BPH with obstruction/lower urinary tract symptoms    [facility] is following pt and checking annual PSA's (most recent visit with them was 11/2017)  . GERD (gastroesophageal reflux disease)   . Hemorrhoids   . Hydrocele, bilateral    + epididymal cyst--->[facility]rol following [facility]).  . Hypercholesterolemia   . Hypertension   . Microscopic hematuria 2014   u/s and cystoscopy normal.  No recurrence.  No gross hematuria.  . Prediabetes   . Prostate cancer screening 12/22/2017   PSA done at [facility] 12/22/17 (Dr. Gala Lewandowsky)  . PVD (peripheral vascular disease) (HCC)   . Right ureteral stone 05/2011  . Vitamin D deficiency     Past Surgical History:  Procedure Laterality Date  . COLONOSCOPY  02/05/2008   for rectal bleeding: normal (presumed cause was hemorrhoids). ([facility]).  Repeat 2018 normal.  . KNEE SURGERY    . SHOULDER SURGERY      Family History  Problem Relation Age of Onset  . Cervical cancer Mother   . Diabetes Mother   . Hyperlipidemia Mother   . Heart disease Father   . Diabetes Sister     Social History   Socioeconomic History  . Marital status: Married    Spouse name: Not on file  . Number of children: Not on file  . Years of education: Not on file  . Highest education level: Not on file  Occupational History  . Not on file  Social Needs  . Financial resource strain: Not on file  . Food insecurity    Worry: Not on file    Inability: Not on file  . Transportation needs     Medical: Not on file    Non-medical: Not on file  Tobacco Use  . Smoking status: Never Smoker  . Smokeless tobacco: Never Used  Substance and Sexual Activity  . Alcohol use: Never    Frequency: Never  . Drug use: Never  . Sexual activity: Not on file  Lifestyle  . Physical activity    Days per week: Not on file    Minutes per session: Not on file  . Stress: Not on file  Relationships  . Social Musician on phone: Not on file    Gets together: Not on file    Attends religious service: Not on file    Active member of club or organization: Not on file    Attends meetings of clubs or organizations: Not on file    Relationship status: Not on file  . Intimate partner violence    Fear of current or ex partner: Not on file    Emotionally abused: Not on file    Physically abused: Not on file    Forced sexual activity: Not on file  Other Topics Concern  . Not on file  Social History Narrative   Married, 3 children.   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   No T/A/Ds.    Outpatient Medications Prior to Visit  Medication Sig Dispense Refill  . amLODipine-benazepril (LOTREL) 10-20 MG capsule Take 1 capsule by mouth daily. OFFICE VISIT NEEDED 30 capsule 0  . rosuvastatin (CRESTOR) 20 MG tablet TAKE 1 TABLET BY MOUTH EVERY DAY 90 tablet 3  . cyclobenzaprine (FLEXERIL) 10 MG tablet Take 1 tablet (10 mg total) by mouth 2 (two) times daily as needed for up to 15 doses for muscle spasms. (Patient not taking: Reported on 11/30/2018) 15 tablet 0  . fluticasone (FLONASE) 50 MCG/ACT nasal spray Place 2 sprays into both nostrils daily. (Patient not taking: Reported on 04/22/2018) 16 g 6  . ketoconazole (NIZORAL) 2 % shampoo 1 application three times per week (Patient not taking: Reported on 06/02/2018) 120 mL 2  . metoprolol succinate (TOPROL-XL) 25 MG 24 hr tablet TAKE 1 TABLET BY MOUTH DAILY (Patient not taking: Reported on 11/30/2018) 30 tablet 0    No facility-administered medications prior to visit.     Allergies  Allergen Reactions  . Hctz [Hydrochlorothiazide] Other (See Comments)    ED    ROS Review of Systems  Constitutional: Negative for appetite change, chills, fatigue and fever.  HENT: Negative for congestion, dental problem, ear pain and sore throat.   Eyes: Negative for discharge, redness and visual disturbance.  Respiratory: Negative for cough, chest tightness, shortness of breath and wheezing.   Cardiovascular: Negative for chest pain, palpitations and leg swelling.  Gastrointestinal: Negative for abdominal pain, blood in stool, diarrhea, nausea and vomiting.  Genitourinary: Negative for difficulty urinating, dysuria, flank pain, frequency, hematuria and urgency.  Musculoskeletal: Negative for arthralgias, back pain, joint swelling, myalgias and neck stiffness.  Skin: Negative for pallor and rash.  Neurological: Negative for dizziness, speech difficulty, weakness and headaches.  Hematological: Negative for adenopathy. Does not bruise/bleed easily.  Psychiatric/Behavioral: Negative for confusion and sleep disturbance. The patient is not nervous/anxious.     PE; Blood pressure 121/76, pulse 84, temperature 98.7 F (37.1 C), temperature source Temporal, resp. rate 16, height 5\' 10"  (1.778 m), weight 234 lb (106.1 kg), SpO2 100 %. Body mass index is 33.58 kg/m.  Gen: Alert, well appearing.  Patient is oriented to person, place, time, and situation. AFFECT: pleasant, lucid thought and speech. ENT: Ears: EACs clear, normal epithelium.  TMs with good light reflex and landmarks bilaterally.  Eyes: no injection, icteris, swelling, or exudate.  EOMI, PERRLA. Nose: no drainage or turbinate edema/swelling.  No injection or focal lesion.  Mouth: lips without lesion/swelling.  Oral mucosa pink and moist.  Dentition intact and without obvious caries or gingival swelling.  Oropharynx without erythema, exudate, or swelling.   Neck: supple/nontender.  No LAD, mass, or TM.  Carotid pulses 2+ bilaterally, without bruits. CV: RRR, no m/r/g.   LUNGS: CTA bilat, nonlabored resps, good aeration in all lung fields. ABD: soft, NT, ND, BS normal.  No hepatospenomegaly or mass.  No bruits. EXT: no clubbing, cyanosis, or edema.  Musculoskeletal: no joint swelling, erythema, warmth, or tenderness.  ROM of all joints intact. Skin - no sores or suspicious lesions or rashes or color changes   Pertinent labs:  Lab Results  Component Value Date   TSH 0.88 11/11/2017   Lab Results  Component Value Date   WBC 3.9 (L) 11/11/2017   HGB 14.3 11/11/2017   HCT 44.2 11/11/2017   MCV 86.9 11/11/2017   PLT 218.0 11/11/2017   Lab Results  Component Value Date  CREATININE 0.99 10/22/2017   BUN 13 10/22/2017   NA 142 10/22/2017   K 3.8 10/22/2017   CL 105 10/22/2017   CO2 31 10/22/2017   Lab Results  Component Value Date   ALT 15 10/22/2017   AST 12 10/22/2017   ALKPHOS 58 10/22/2017   BILITOT 0.6 10/22/2017   Lab Results  Component Value Date   CHOL 160 10/22/2017   Lab Results  Component Value Date   HDL 46.70 10/22/2017   Lab Results  Component Value Date   LDLCALC 103 (H) 10/22/2017   Lab Results  Component Value Date   TRIG 52.0 10/22/2017   Lab Results  Component Value Date   CHOLHDL 3 10/22/2017   Lab Results  Component Value Date   PSA 1.48 11/11/2017   PSA 1.3 10/31/2015   PSA 0.65 01/07/2008   Lab Results  Component Value Date   HGBA1C 5.3 11/11/2017    ASSESSMENT AND PLAN:   Health maintenance exam: Reviewed age and gender appropriate health maintenance issues (prudent diet, regular exercise, health risks of tobacco and excessive alcohol, use of seatbelts, fire alarms in home, use of sunscreen).  Also reviewed age and gender appropriate health screening as well as vaccine recommendations. Vaccines: flu->given today.   Shingrix->he'll think about it. Labs: fasting HP labs  ordered->return to lab when fasting. Prostate ca screening:  PSAs have been followed with [facility] urologist->has appt at 4pm today. Colon ca screening: next colonoscopy due 2028.  An After Visit Summary was printed and given to the patient.  FOLLOW UP:  Return in about 6 months (around 05/30/2019) for routine chronic illness f/u.  Signed:  Crissie Sickles, MD           11/30/2018

## 2018-11-30 NOTE — Addendum Note (Signed)
Addended by: Marlene Bast A on: 11/30/2018 02:24 PM   Modules accepted: Orders

## 2018-12-02 ENCOUNTER — Other Ambulatory Visit: Payer: Self-pay

## 2018-12-02 ENCOUNTER — Ambulatory Visit (INDEPENDENT_AMBULATORY_CARE_PROVIDER_SITE_OTHER): Payer: 59

## 2018-12-02 DIAGNOSIS — E7800 Pure hypercholesterolemia, unspecified: Secondary | ICD-10-CM

## 2018-12-02 DIAGNOSIS — I739 Peripheral vascular disease, unspecified: Secondary | ICD-10-CM

## 2018-12-02 DIAGNOSIS — I10 Essential (primary) hypertension: Secondary | ICD-10-CM

## 2018-12-02 DIAGNOSIS — R7303 Prediabetes: Secondary | ICD-10-CM

## 2018-12-02 LAB — LIPID PANEL
Cholesterol: 162 mg/dL (ref 0–200)
HDL: 54.3 mg/dL (ref >39.00)
LDL Cholesterol: 95 mg/dL (ref 0–99)
NonHDL: 107.56
Total CHOL/HDL Ratio: 3
Triglycerides: 65 mg/dL (ref 0.0–149.0)
VLDL: 13 mg/dL (ref 0.0–40.0)

## 2018-12-02 LAB — TSH: TSH: 1.35 u[IU]/mL (ref 0.35–4.50)

## 2018-12-02 LAB — CBC WITH DIFFERENTIAL/PLATELET
Basophils Absolute: 0.1 10*3/uL (ref 0.0–0.1)
Basophils Relative: 1.5 % (ref 0.0–3.0)
Eosinophils Absolute: 0.2 10*3/uL (ref 0.0–0.7)
Eosinophils Relative: 5.4 % — ABNORMAL HIGH (ref 0.0–5.0)
HCT: 44.5 % (ref 39.0–52.0)
Hemoglobin: 14.5 g/dL (ref 13.0–17.0)
Lymphocytes Relative: 37.4 % (ref 12.0–46.0)
Lymphs Abs: 1.6 10*3/uL (ref 0.7–4.0)
MCHC: 32.5 g/dL (ref 30.0–36.0)
MCV: 88 fl (ref 78.0–100.0)
Monocytes Absolute: 0.4 10*3/uL (ref 0.1–1.0)
Monocytes Relative: 10.4 % (ref 3.0–12.0)
Neutro Abs: 1.9 10*3/uL (ref 1.4–7.7)
Neutrophils Relative %: 45.3 % (ref 43.0–77.0)
Platelets: 211 10*3/uL (ref 150.0–400.0)
RBC: 5.05 Mil/uL (ref 4.22–5.81)
RDW: 13.8 % (ref 11.5–15.5)
WBC: 4.2 10*3/uL (ref 4.0–10.5)

## 2018-12-02 LAB — COMPREHENSIVE METABOLIC PANEL
ALT: 12 U/L (ref 0–53)
AST: 15 U/L (ref 0–37)
Albumin: 4.3 g/dL (ref 3.5–5.2)
Alkaline Phosphatase: 57 U/L (ref 39–117)
BUN: 10 mg/dL (ref 6–23)
CO2: 32 mEq/L (ref 19–32)
Calcium: 9.3 mg/dL (ref 8.4–10.5)
Chloride: 102 mEq/L (ref 96–112)
Creatinine, Ser: 0.99 mg/dL (ref 0.40–1.50)
GFR: 93.2 mL/min (ref >60.00)
Glucose, Bld: 87 mg/dL (ref 70–99)
Potassium: 3.5 mEq/L (ref 3.5–5.1)
Sodium: 141 mEq/L (ref 135–145)
Total Bilirubin: 0.7 mg/dL (ref 0.2–1.2)
Total Protein: 7.2 g/dL (ref 6.0–8.3)

## 2018-12-02 LAB — HEMOGLOBIN A1C: Hgb A1c MFr Bld: 5.2 % (ref 4.6–6.5)

## 2018-12-04 ENCOUNTER — Telehealth: Payer: Self-pay

## 2018-12-04 NOTE — Telephone Encounter (Signed)
Signed and put in box to go up front.  

## 2018-12-04 NOTE — Telephone Encounter (Signed)
Data health screen form form for insurance completed for patient. Placed on Dr Isla Pence desk for signature. Pt would like faxed when complete. [name]

## 2018-12-07 ENCOUNTER — Other Ambulatory Visit: Payer: Self-pay

## 2018-12-07 MED ORDER — AMLODIPINE BESY-BENAZEPRIL HCL 10-20 MG PO CAPS: 1.0000 | ORAL_CAPSULE | Freq: Every day | ORAL | 0 refills | Status: DC

## 2018-12-07 NOTE — Telephone Encounter (Signed)
Form has been faxed, pt was notified. Placed in bin to go up front for scan.

## 2019-01-06 ENCOUNTER — Other Ambulatory Visit: Payer: Self-pay

## 2019-01-06 MED ORDER — ROSUVASTATIN CALCIUM 20 MG PO TABS: 20.0000 mg | ORAL_TABLET | Freq: Every day | ORAL | 1 refills | Status: DC

## 2019-03-23 ENCOUNTER — Other Ambulatory Visit: Payer: Self-pay

## 2019-03-23 MED ORDER — AMLODIPINE BESY-BENAZEPRIL HCL 10-20 MG PO CAPS: 1.0000 | ORAL_CAPSULE | Freq: Every day | ORAL | 0 refills | Status: DC

## 2019-05-12 ENCOUNTER — Ambulatory Visit: Payer: 59 | Admitting: Family Medicine

## 2019-05-12 ENCOUNTER — Other Ambulatory Visit: Payer: Self-pay

## 2019-05-14 ENCOUNTER — Other Ambulatory Visit: Payer: Self-pay

## 2019-05-17 ENCOUNTER — Other Ambulatory Visit: Payer: Self-pay

## 2019-05-17 ENCOUNTER — Ambulatory Visit: Payer: 59 | Admitting: Family Medicine

## 2019-05-17 ENCOUNTER — Encounter: Payer: Self-pay | Admitting: Family Medicine

## 2019-05-17 VITALS — BP 130/85 | HR 70 | Temp 98.3°F | Resp 16 | Ht 70.0 in | Wt 243.4 lb

## 2019-05-17 DIAGNOSIS — M79605 Pain in left leg: Secondary | ICD-10-CM | POA: Diagnosis not present

## 2019-05-17 MED ORDER — ASPIRIN EC 81 MG PO TBEC: 81.0000 mg | DELAYED_RELEASE_TABLET | Freq: Every day | ORAL | 0 refills | Status: AC

## 2019-05-17 NOTE — Progress Notes (Signed)
OFFICE VISIT  05/17/2019   CC:  Chief Complaint  Patient presents with  . Cramping in left leg    x2-3 weeks now, occurring mainly at night   HPI:    Patient is a 59 y.o. male who presents for "cramping in left leg". Has has about 1 mo hx of recurrent "jabbing" type of pain in both LL.  Fleeing, not tied to any specific activity. Also L lower leg cramping pain in achilles area.  No injury prior to onset. No swelling or bruising noted.  Notes his pain occurs more consistently at night, at least he notices them more. No pain in legs with ambulation. No color changes in legs. No meds tried.    ROS: no fevers, no CP, no SOB, no wheezing, no cough, no dizziness, no HAs, no rashes, no melena/hematochezia.  No polyuria or polydipsia.  No myalgias or arthralgias.  No focal weakness, paresthesias, or tremors.  No acute vision or hearing abnormalities. No n/v/d or abd pain.  No palpitations.      Past Medical History:  Diagnosis Date  . Atherosclerosis of native artery of extremity (HCC)   . BPH with obstruction/lower urinary tract symptoms    [facility] is following pt and checking annual PSA's (most recent visit with them was 11/2017)  . GERD (gastroesophageal reflux disease)   . Hemorrhoids   . Hydrocele, bilateral    + epididymal cyst--->urol following [facility]).  . Hypercholesterolemia   . Hypertension   . Microscopic hematuria 2014   u/s and cystoscopy normal.  No recurrence.  No gross hematuria.  . Prediabetes   . Prostate cancer screening 12/22/2017   PSA done at [facility] 12/22/17 (Dr. Gala Lewandowsky)  . PVD (peripheral vascular disease) (HCC)   . Right ureteral stone 05/2011  . Vitamin D deficiency     Past Surgical History:  Procedure Laterality Date  . COLONOSCOPY  02/05/2008   for rectal bleeding: normal (presumed cause was hemorrhoids). ([facility]).  Repeat 2018 normal.  . KNEE SURGERY    . SHOULDER SURGERY      Outpatient Medications Prior to Visit  Medication Sig  Dispense Refill  . amLODipine-benazepril (LOTREL) 10-20 MG capsule Take 1 capsule by mouth daily. 90 capsule 0  . oxybutynin (DITROPAN-XL) 10 MG 24 hr tablet Take 10 mg by mouth daily.    . rosuvastatin (CRESTOR) 20 MG tablet Take 1 tablet (20 mg total) by mouth daily. 90 tablet 1  . cyclobenzaprine (FLEXERIL) 10 MG tablet Take 1 tablet (10 mg total) by mouth 2 (two) times daily as needed for up to 15 doses for muscle spasms. (Patient not taking: Reported on 11/30/2018) 15 tablet 0   No facility-administered medications prior to visit.    Allergies  Allergen Reactions  . Hctz [Hydrochlorothiazide] Other (See Comments)    ED    ROS As per HPI  PE: Blood pressure 130/85, pulse 70, temperature 98.3 F (36.8 C), temperature source Temporal, resp. rate 16, height 5\' 10"  (1.778 m), weight 243 lb 6.4 oz (110.4 kg), SpO2 100 %. Gen: Alert, well appearing.  Patient is oriented to person, place, time, and situation. AFFECT: pleasant, lucid thought and speech. LEGS: 1+ pitting edema R LL pretibial region, trace pitting edema L LL pretibial region. Trace PT pulses bilat, 1-2 + PT pulses bilat.  Skin warm/intact.  Feet w/out rash or focal lesion. Popliteal areas and calves w/out tenderness or erythema or warmth.   No significant varicosities present. Achilles tendon w/out tenderness.  No  pain with resisted ankle flexion or extension.   LABS:    Chemistry      Component Value Date/Time   NA 141 12/02/2018 0909   NA 146 10/31/2015 0000   K 3.5 12/02/2018 0909   CL 102 12/02/2018 0909   CO2 32 12/02/2018 0909   BUN 10 12/02/2018 0909   BUN 14 10/31/2015 0000   CREATININE 0.99 12/02/2018 0909   GLU 84 10/31/2015 0000      Component Value Date/Time   CALCIUM 9.3 12/02/2018 0909   ALKPHOS 57 12/02/2018 0909   AST 15 12/02/2018 0909   ALT 12 12/02/2018 0909   BILITOT 0.7 12/02/2018 0909     Lab Results  Component Value Date   WBC 4.2 12/02/2018   HGB 14.5 12/02/2018   HCT 44.5  12/02/2018   MCV 88.0 12/02/2018   PLT 211.0 12/02/2018   Lab Results  Component Value Date   CHOL 162 12/02/2018   HDL 54.30 12/02/2018   LDLCALC 95 12/02/2018   LDLDIRECT 168.0 01/07/2008   TRIG 65.0 12/02/2018   CHOLHDL 3 12/02/2018   Lab Results  Component Value Date   HGBA1C 5.2 12/02/2018    IMPRESSION AND PLAN:  Vauge L LL pain w/out significant red flags for vascular etiology. ? Muscular. ?RLS but seems primarily to be only L leg. Monitor sx's.  All sx's seem to have resolved the last several days.  Signs/symptoms to call or return for were reviewed and pt expressed understanding.  An After Visit Summary was printed and given to the patient.  FOLLOW UP: Return if symptoms worsen or fail to improve.  Signed:  Crissie Sickles, MD           05/17/2019

## 2019-10-13 ENCOUNTER — Telehealth: Payer: Self-pay

## 2019-10-13 MED ORDER — AMLODIPINE BESY-BENAZEPRIL HCL 10-20 MG PO CAPS: 1.0000 | ORAL_CAPSULE | Freq: Every day | ORAL | 0 refills | Status: DC

## 2019-10-13 NOTE — Telephone Encounter (Signed)
Spoke with patient and advised overdue for f/u appt. Appt scheduled and 30 day supply sent in. Patient made aware refill sent. Nothing further needed  Patient Name: Christopher Cobb Gender: Male DOB: [birth year] Age: 59 Y 2 M 10 D Return Phone Number: [phone] (Primary), [phone] (Secondary) Address: City/State/Zip: [place] [address] Client [facility] Day - Client Client Site [facility] - Day Physician Santiago Bumpers - MD Contact Type Call Who Is Calling Patient / Member / Family / Caregiver Call Type Triage / Clinical Relationship To Patient Self Return Phone Number [phone] (Primary) Chief Complaint Prescription Refill or Medication Request (non symptomatic) Reason for Call Medication Question / Request Initial Comment Caller is needing his BP meds refilled. The medication is amlodipine and he uses [facility] on [address] and [address] The pt is out of his medication and needs it tonight. Translation No Nurse Assessment Nurse: Jethro Poling, RN, Kayla Date/Time (Eastern Time): 10/12/2019 6:35:56 PM Please select the assessment type ---Refill Additional Documentation ---Caller reports he is in need of a refill of his Amlodipine sent to [facility]. Does the patient have enough medication to last until the office opens? ---No Additional Documentation ---Caller denies current symptoms needing triaged at this time. Disp. Time [name] Disposition Final User 10/12/2019 6:17:25 PM Send To RN Personal Raymon Mutton, RN, Misty Stanley 10/12/2019 6:38:16 PM Clinical Call Yes Jethro Poling, RN, Dorathy Daft Comments User: Bjorn Pippin, RN Date/Time [name]: 10/12/2019 6:37:38 PM Advised caller to call [facility] in to ask for loaner dose and to call office in AM in regards to refill request. Caller stated understanding

## 2019-10-26 ENCOUNTER — Other Ambulatory Visit: Payer: Self-pay

## 2019-10-26 ENCOUNTER — Encounter: Payer: Self-pay | Admitting: Family Medicine

## 2019-10-26 ENCOUNTER — Ambulatory Visit (INDEPENDENT_AMBULATORY_CARE_PROVIDER_SITE_OTHER): Payer: 59 | Admitting: Family Medicine

## 2019-10-26 VITALS — BP 136/90 | HR 84 | Temp 97.9°F | Resp 16 | Ht 70.0 in | Wt 240.4 lb

## 2019-10-26 DIAGNOSIS — I10 Essential (primary) hypertension: Secondary | ICD-10-CM

## 2019-10-26 DIAGNOSIS — E7800 Pure hypercholesterolemia, unspecified: Secondary | ICD-10-CM

## 2019-10-26 DIAGNOSIS — Z23 Encounter for immunization: Secondary | ICD-10-CM

## 2019-10-26 DIAGNOSIS — R7303 Prediabetes: Secondary | ICD-10-CM | POA: Diagnosis not present

## 2019-10-26 LAB — HEMOGLOBIN A1C: Hgb A1c MFr Bld: 5.5 % (ref 4.6–6.5)

## 2019-10-26 LAB — LIPID PANEL
Cholesterol: 156 mg/dL (ref 0–200)
HDL: 56.6 mg/dL (ref >39.00)
LDL Cholesterol: 88 mg/dL (ref 0–99)
NonHDL: 99.38
Total CHOL/HDL Ratio: 3
Triglycerides: 57 mg/dL (ref 0.0–149.0)
VLDL: 11.4 mg/dL (ref 0.0–40.0)

## 2019-10-26 LAB — COMPREHENSIVE METABOLIC PANEL
ALT: 11 U/L (ref 0–53)
AST: 14 U/L (ref 0–37)
Albumin: 4.2 g/dL (ref 3.5–5.2)
Alkaline Phosphatase: 59 U/L (ref 39–117)
BUN: 11 mg/dL (ref 6–23)
CO2: 31 mEq/L (ref 19–32)
Calcium: 9.1 mg/dL (ref 8.4–10.5)
Chloride: 104 mEq/L (ref 96–112)
Creatinine, Ser: 1.04 mg/dL (ref 0.40–1.50)
GFR: 87.78 mL/min (ref >60.00)
Glucose, Bld: 87 mg/dL (ref 70–99)
Potassium: 3.6 mEq/L (ref 3.5–5.1)
Sodium: 141 mEq/L (ref 135–145)
Total Bilirubin: 0.6 mg/dL (ref 0.2–1.2)
Total Protein: 7.3 g/dL (ref 6.0–8.3)

## 2019-10-26 MED ORDER — ROSUVASTATIN CALCIUM 20 MG PO TABS
20.0000 mg | ORAL_TABLET | Freq: Every day | ORAL | 3 refills | Status: DC
Start: 2019-10-26 — End: 2020-04-06

## 2019-10-26 MED ORDER — AMLODIPINE BESY-BENAZEPRIL HCL 10-20 MG PO CAPS
1.0000 | ORAL_CAPSULE | Freq: Every day | ORAL | 3 refills | Status: DC
Start: 2019-10-26 — End: 2020-04-18

## 2019-10-26 NOTE — Progress Notes (Signed)
OFFICE VISIT  10/26/2019  CC:  Chief Complaint  Patient presents with  . Follow-up    Fasting    HPI:    Patient is a 59 y.o.  male who presents for f/u prediabetes, HTN, HLD. I last saw him for routine f/o of these problems + CPE about 10 months ago. All stable at that time, including labs.  INTERIM HX: Feeling well. No complaints. Very busy with work. Hasn't been to [place] in a while.  HTN: no home bp monitoring. Ran out of med for a week or so and eventually started feeling mild head ache/pressure.  HLD: tolerating statin, compliant with this.  Prediabetes: walks for exercise and this has been very sporadic. Diet has not been good, "my wife loves to cook".   ROS: no fevers, no CP, no SOB, no wheezing, no cough, no dizziness, no HAs, no rashes, no melena/hematochezia.  No polyuria or polydipsia.  No myalgias or arthralgias.  No focal weakness, paresthesias, or tremors.  No acute vision or hearing abnormalities. No n/v/d or abd pain.  No palpitations.   No pain in calves with walking.  Past Medical History:  Diagnosis Date  . Atherosclerosis of native artery of extremity (HCC)   . BPH with obstruction/lower urinary tract symptoms    [facility] urol is following pt and checking annual PSA's (most recent visit with them was 11/2017)  . GERD (gastroesophageal reflux disease)   . Hemorrhoids   . Hydrocele, bilateral    + epididymal cyst--->urol following [facility]).  . Hypercholesterolemia   . Hypertension   . Microscopic hematuria 2014   u/s and cystoscopy normal.  No recurrence.  No gross hematuria.  . Prediabetes   . Prostate cancer screening 12/22/2017   PSA screening is done by his urologist  . PVD (peripheral vascular disease) (HCC)   . Right ureteral stone 05/2011  . Vitamin D deficiency     Past Surgical History:  Procedure Laterality Date  . COLONOSCOPY  02/05/2008; 2018   2010 ->for rectal bleeding: normal (presumed cause was hemorrhoids). ([facility]).   Repeat 2018 normal.  Recall 2028.  Marland Kitchen KNEE SURGERY    . SHOULDER SURGERY      Outpatient Medications Prior to Visit  Medication Sig Dispense Refill  . aspirin EC 81 MG tablet Take 1 tablet (81 mg total) by mouth daily. 30 tablet 0  . amLODipine-benazepril (LOTREL) 10-20 MG capsule Take 1 capsule by mouth daily. 30 capsule 0  . oxybutynin (DITROPAN-XL) 10 MG 24 hr tablet Take 10 mg by mouth daily.    . rosuvastatin (CRESTOR) 20 MG tablet Take 1 tablet (20 mg total) by mouth daily. 90 tablet 1  . cyclobenzaprine (FLEXERIL) 10 MG tablet Take 1 tablet (10 mg total) by mouth 2 (two) times daily as needed for up to 15 doses for muscle spasms. (Patient not taking: Reported on 11/30/2018) 15 tablet 0   No facility-administered medications prior to visit.    Allergies  Allergen Reactions  . Hctz [Hydrochlorothiazide] Other (See Comments)    ED    ROS As per HPI  PE: Vitals with BMI 10/26/2019 05/17/2019 11/30/2018  Height 5\' 10"  5\' 10"  5\' 10"   Weight 240 lbs 6 oz 243 lbs 6 oz 234 lbs  BMI 34.49 34.92 33.58  Systolic 148 130  Diastolic 92 85 76  Pulse 84 70 84  Repeat bp manual cuff at end of visit today was 136/90  Gen: Alert, well appearing.  Patient is oriented to  person, place, time, and situation. AFFECT: pleasant, lucid thought and speech. CV: RRR, no m/r/g.   LUNGS: CTA bilat, nonlabored resps, good aeration in all lung fields. EXT: no clubbing or cyanosis.  no edema.    LABS:  Lab Results  Component Value Date   TSH 1.35 12/02/2018   Lab Results  Component Value Date   WBC 4.2 12/02/2018   HGB 14.5 12/02/2018   HCT 44.5 12/02/2018   MCV 88.0 12/02/2018   PLT 211.0 12/02/2018   Lab Results  Component Value Date   CREATININE 0.99 12/02/2018   BUN 10 12/02/2018   NA 141 12/02/2018   K 3.5 12/02/2018   CL 102 12/02/2018   CO2 32 12/02/2018   Lab Results  Component Value Date   ALT 12 12/02/2018   AST 15 12/02/2018   ALKPHOS 57 12/02/2018   BILITOT 0.7  12/02/2018   Lab Results  Component Value Date   CHOL 162 12/02/2018   Lab Results  Component Value Date   HDL 54.30 12/02/2018   Lab Results  Component Value Date   LDLCALC 95 12/02/2018   Lab Results  Component Value Date   TRIG 65.0 12/02/2018   Lab Results  Component Value Date   CHOLHDL 3 12/02/2018   Lab Results  Component Value Date   PSA 1.48 11/11/2017   PSA 1.3 10/31/2015   PSA 0.65 01/07/2008   Lab Results  Component Value Date   HGBA1C 5.2 12/02/2018    IMPRESSION AND PLAN:  1) HTN: needs to restart home monitoring. Twice per week for 1 mo and if consistently <130/80 then can check 1-2 times per month.  If consistently >140/90 then make appt to return. Lytes/cr today.  RF'd lotrel 10-20 qd.  2) HLD: tolerating statin. Noncompliant with healthy diet and adequate exercise. FLP and hepatic panel today.  RF'd crestor today.  3) Prediabetes: A1c has been stable on annual basis for a while now. Will recheck A1c today as well as fasting glucose. Encouraged good dietary changes and get back into exercise habit.  An After Visit Summary was printed and given to the patient.  FOLLOW UP: Return in about 6 months (around 04/24/2020) for annual CPE (fasting).  Signed:  Santiago Bumpers, MD           10/26/2019

## 2019-12-10 ENCOUNTER — Telehealth: Payer: Self-pay

## 2019-12-10 NOTE — Telephone Encounter (Signed)
Placed on PCP desk to review and sign, if appropriate.  

## 2019-12-10 NOTE — Telephone Encounter (Signed)
Signed and put in box to go up front. Signed:  Santiago Bumpers, MD           12/10/2019

## 2019-12-10 NOTE — Telephone Encounter (Signed)
Patient wife dropped off forms to be completed for herself and her husband. Christopher Cobb is patient of Dr. Milinda Cave.  [employer] Data processing manager for health screening.  I advised wife (DPR) patient has not had an annual physical with Dr. Milinda Cave this year.  Last physical was on 11/30/2018. I told her I am unsure of the form could be completed.  Gave form to [place]. Patient wife is to be contacted.  Christopher Cobb cell is [phone].

## 2019-12-13 NOTE — Telephone Encounter (Signed)
Faxed form to insurance. Mother already aware to come pick up originals.

## 2020-01-13 ENCOUNTER — Ambulatory Visit: Payer: 59 | Admitting: Family Medicine

## 2020-04-06 ENCOUNTER — Other Ambulatory Visit: Payer: Self-pay | Admitting: Family Medicine

## 2020-04-18 ENCOUNTER — Encounter: Payer: Self-pay | Admitting: Family Medicine

## 2020-04-18 ENCOUNTER — Other Ambulatory Visit: Payer: Self-pay

## 2020-04-18 ENCOUNTER — Ambulatory Visit (INDEPENDENT_AMBULATORY_CARE_PROVIDER_SITE_OTHER): Payer: 59 | Admitting: Family Medicine

## 2020-04-18 VITALS — BP 140/90 | HR 77 | Temp 98.3°F | Resp 16 | Ht 70.0 in | Wt 243.4 lb

## 2020-04-18 DIAGNOSIS — R7303 Prediabetes: Secondary | ICD-10-CM | POA: Diagnosis not present

## 2020-04-18 DIAGNOSIS — E7800 Pure hypercholesterolemia, unspecified: Secondary | ICD-10-CM | POA: Diagnosis not present

## 2020-04-18 DIAGNOSIS — Z0001 Encounter for general adult medical examination with abnormal findings: Secondary | ICD-10-CM | POA: Diagnosis not present

## 2020-04-18 DIAGNOSIS — K6289 Other specified diseases of anus and rectum: Secondary | ICD-10-CM | POA: Insufficient documentation

## 2020-04-18 DIAGNOSIS — Z23 Encounter for immunization: Secondary | ICD-10-CM | POA: Diagnosis not present

## 2020-04-18 DIAGNOSIS — I10 Essential (primary) hypertension: Secondary | ICD-10-CM

## 2020-04-18 DIAGNOSIS — K5900 Constipation, unspecified: Secondary | ICD-10-CM | POA: Insufficient documentation

## 2020-04-18 DIAGNOSIS — Z0000 Encounter for general adult medical examination without abnormal findings: Secondary | ICD-10-CM

## 2020-04-18 LAB — CBC WITH DIFFERENTIAL/PLATELET
Basophils Absolute: 0 10*3/uL (ref 0.0–0.1)
Basophils Relative: 0.6 % (ref 0.0–3.0)
Eosinophils Absolute: 0.3 10*3/uL (ref 0.0–0.7)
Eosinophils Relative: 6.9 % — ABNORMAL HIGH (ref 0.0–5.0)
HCT: 45.9 % (ref 39.0–52.0)
Hemoglobin: 15 g/dL (ref 13.0–17.0)
Lymphocytes Relative: 44.8 % (ref 12.0–46.0)
Lymphs Abs: 2 10*3/uL (ref 0.7–4.0)
MCHC: 32.7 g/dL (ref 30.0–36.0)
MCV: 86.5 fl (ref 78.0–100.0)
Monocytes Absolute: 0.4 10*3/uL (ref 0.1–1.0)
Monocytes Relative: 9 % (ref 3.0–12.0)
Neutro Abs: 1.7 10*3/uL (ref 1.4–7.7)
Neutrophils Relative %: 38.7 % — ABNORMAL LOW (ref 43.0–77.0)
Platelets: 191 10*3/uL (ref 150.0–400.0)
RBC: 5.31 Mil/uL (ref 4.22–5.81)
RDW: 13.6 % (ref 11.5–15.5)
WBC: 4.4 10*3/uL (ref 4.0–10.5)

## 2020-04-18 LAB — LIPID PANEL
Cholesterol: 150 mg/dL (ref 0–200)
HDL: 52.1 mg/dL (ref >39.00)
LDL Cholesterol: 86 mg/dL (ref 0–99)
NonHDL: 98.19
Total CHOL/HDL Ratio: 3
Triglycerides: 62 mg/dL (ref 0.0–149.0)
VLDL: 12.4 mg/dL (ref 0.0–40.0)

## 2020-04-18 LAB — COMPREHENSIVE METABOLIC PANEL
ALT: 16 U/L (ref 0–53)
AST: 17 U/L (ref 0–37)
Albumin: 4.2 g/dL (ref 3.5–5.2)
Alkaline Phosphatase: 57 U/L (ref 39–117)
BUN: 11 mg/dL (ref 6–23)
CO2: 33 mEq/L — ABNORMAL HIGH (ref 19–32)
Calcium: 9.2 mg/dL (ref 8.4–10.5)
Chloride: 105 mEq/L (ref 96–112)
Creatinine, Ser: 1 mg/dL (ref 0.40–1.50)
GFR: 81.12 mL/min (ref >60.00)
Glucose, Bld: 92 mg/dL (ref 70–99)
Potassium: 4 mEq/L (ref 3.5–5.1)
Sodium: 142 mEq/L (ref 135–145)
Total Bilirubin: 0.6 mg/dL (ref 0.2–1.2)
Total Protein: 6.8 g/dL (ref 6.0–8.3)

## 2020-04-18 LAB — TSH: TSH: 1.3 u[IU]/mL (ref 0.35–4.50)

## 2020-04-18 LAB — HEMOGLOBIN A1C: Hgb A1c MFr Bld: 5.4 % (ref 4.6–6.5)

## 2020-04-18 MED ORDER — AMLODIPINE BESY-BENAZEPRIL HCL 10-40 MG PO CAPS: 1.0000 | ORAL_CAPSULE | Freq: Every day | ORAL | 1 refills | Status: DC

## 2020-04-18 NOTE — Progress Notes (Signed)
Office Note 04/18/2020  CC:  Chief Complaint  Patient presents with  . Annual Exam    Pt is fasting   HPI:  Christopher Cobb is a 60 y.o. male who is here for annual health maintenance exam and 6 mo f/u prediabetes, HTN, HLD. A/P as of last visit: "1) HTN: needs to restart home monitoring. Twice per week for 1 mo and if consistently <130/80 then can check 1-2 times per month.  If consistently >140/90 then make appt to return. Lytes/cr today.  RF'd lotrel 10-20 qd.  2) HLD: tolerating statin. Noncompliant with healthy diet and adequate exercise. FLP and hepatic panel today.  RF'd crestor today.  3) Prediabetes: A1c has been stable on annual basis for a while now. Will recheck A1c today as well as fasting glucose. Encouraged good dietary changes and get back into exercise habit."  INTERIM HX: Doing fine. No recent home bp checks. In the past, home bp avg 135/upper 80s.  No regular exercise in last couple years since covid pandemic hit. Just bought a treadmill and plans on restart regular cv exercise. Has recently started getting better with food choices.   Past Medical History:  Diagnosis Date  . Atherosclerosis of native artery of extremity (HCC)   . BPH with obstruction/lower urinary tract symptoms    [facility] urol is following pt and checking annual PSA's (most recent visit with them was 11/2017)  . GERD (gastroesophageal reflux disease)   . Hemorrhoids   . Hydrocele, bilateral    + epididymal cyst--->urol following [facility]).  . Hypercholesterolemia   . Hypertension   . Microscopic hematuria 2014   u/s and cystoscopy normal.  No recurrence.  No gross hematuria.  . Prediabetes   . Prostate cancer screening 12/22/2017   PSA screening is done by his urologist  . PVD (peripheral vascular disease) (HCC)   . Right ureteral stone 05/2011  . Vitamin D deficiency     Past Surgical History:  Procedure Laterality Date  . COLONOSCOPY  02/05/2008; 2018   2010 ->for  rectal bleeding: normal (presumed cause was hemorrhoids). ([facility]).  Repeat 2018 ("somewhere in [place]")- normal except hemorrhoids.  Recall 2028.  Marland Kitchen KNEE SURGERY    . SHOULDER SURGERY      Family History  Problem Relation Age of Onset  . Cervical cancer Mother   . Diabetes Mother   . Hyperlipidemia Mother   . Heart disease Father   . Diabetes Sister     Social History   Socioeconomic History  . Marital status: Married    Spouse name: Not on file  . Number of children: Not on file  . Years of education: Not on file  . Highest education level: Not on file  Occupational History  . Not on file  Tobacco Use  . Smoking status: Never Smoker  . Smokeless tobacco: Never Used  Vaping Use  . Vaping Use: Never used  Substance and Sexual Activity  . Alcohol use: Never  . Drug use: Never  . Sexual activity: Not on file  Other Topics Concern  . Not on file  Social History Narrative   Married, 3 children.   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   No T/A/Ds.   Social Determinants of Health   Financial Resource Strain: Not on file  Food Insecurity: Not on file  Transportation Needs: Not on file  Physical Activity: Not on file  Stress: Not on file  Social  Connections: Not on file  Intimate Partner Violence: Not on file    Outpatient Medications Prior to Visit  Medication Sig Dispense Refill  . aspirin EC 81 MG tablet Take 1 tablet (81 mg total) by mouth daily. 30 tablet 0  . rosuvastatin (CRESTOR) 20 MG tablet TAKE 1 TABLET(20 MG) BY MOUTH DAILY 90 tablet 1  . amLODipine-benazepril (LOTREL) 10-20 MG capsule Take 1 capsule by mouth daily. 90 capsule 3   No facility-administered medications prior to visit.    Allergies  Allergen Reactions  . Hctz [Hydrochlorothiazide] Other (See Comments)    ED    ROS Review of Systems  Constitutional: Negative for appetite change, chills, fatigue and fever.  HENT: Negative for congestion,  dental problem, ear pain and sore throat.   Eyes: Negative for discharge, redness and visual disturbance.  Respiratory: Negative for cough, chest tightness, shortness of breath and wheezing.   Cardiovascular: Negative for chest pain, palpitations and leg swelling.  Gastrointestinal: Negative for abdominal pain, blood in stool, diarrhea, nausea and vomiting.  Genitourinary: Negative for difficulty urinating, dysuria, flank pain, frequency, hematuria and urgency.  Musculoskeletal: Negative for arthralgias, back pain, joint swelling, myalgias and neck stiffness.  Skin: Negative for pallor and rash.  Neurological: Negative for dizziness, speech difficulty, weakness and headaches.  Hematological: Negative for adenopathy. Does not bruise/bleed easily.  Psychiatric/Behavioral: Negative for confusion and sleep disturbance. The patient is not nervous/anxious.     PE; Vitals with BMI 04/18/2020 10/26/2019 10/26/2019  Height 5\' 10"  - 5\' 10"   Weight 243 lbs 6 oz - 240 lbs 6 oz  BMI 34.92 - 34.49  Systolic 140 136  Diastolic 90 90 92  Pulse 77 - 84   Gen: Alert, well appearing.  Patient is oriented to person, place, time, and situation. AFFECT: pleasant, lucid thought and speech. ENT: Ears: EACs clear, normal epithelium.  TMs with good light reflex and landmarks bilaterally.  Eyes: no injection, icteris, swelling, or exudate.  EOMI, PERRLA. Nose: no drainage or turbinate edema/swelling.  No injection or focal lesion.  Mouth: lips without lesion/swelling.  Oral mucosa pink and moist.  Dentition intact and without obvious caries or gingival swelling.  Oropharynx without erythema, exudate, or swelling.  Neck: supple/nontender.  No LAD, mass, or TM.  Carotid pulses 2+ bilaterally, without bruits. CV: RRR, no m/r/g.   LUNGS: CTA bilat, nonlabored resps, good aeration in all lung fields. ABD: soft, NT, ND, BS normal.  No hepatospenomegaly or mass.  No bruits. EXT: no clubbing, cyanosis, or edema.   Musculoskeletal: no joint swelling, erythema, warmth, or tenderness.  ROM of all joints intact. Skin - no sores or suspicious lesions or rashes or color changes   Pertinent labs:  Lab Results  Component Value Date   TSH 1.35 12/02/2018   Lab Results  Component Value Date   WBC 4.2 12/02/2018   HGB 14.5 12/02/2018   HCT 44.5 12/02/2018   MCV 88.0 12/02/2018   PLT 211.0 12/02/2018   Lab Results  Component Value Date   CREATININE 1.04 10/26/2019   BUN 11 10/26/2019   NA 141 10/26/2019   K 3.6 10/26/2019   CL 104 10/26/2019   CO2 31 10/26/2019   Lab Results  Component Value Date   ALT 11 10/26/2019   AST 14 10/26/2019   ALKPHOS 59 10/26/2019   BILITOT 0.6 10/26/2019   Lab Results  Component Value Date   CHOL 156 10/26/2019   Lab Results  Component Value Date   HDL  56.60 10/26/2019   Lab Results  Component Value Date   LDLCALC 88 10/26/2019   Lab Results  Component Value Date   TRIG 57.0 10/26/2019   Lab Results  Component Value Date   CHOLHDL 3 10/26/2019   Lab Results  Component Value Date   PSA 1.48 11/11/2017   PSA 1.3 10/31/2015   PSA 0.65 01/07/2008   Lab Results  Component Value Date   HGBA1C 5.5 10/26/2019   ASSESSMENT AND PLAN:   1) HTN: not ideal control. Inc lotrel to 10-40 qd. Lytes/cr today. Monitor bp/hr at home daily and return for review with me in 2 wks.  2) HLD: tolerating rosuva 20mg  qd. FLP and hepatic panel today.  3) Prediabetes: encouraged increased/sustained efforts at [facility]. Hba1c today.  4) Health maintenance exam: Reviewed age and gender appropriate health maintenance issues (prudent diet, regular exercise, health risks of tobacco and excessive alcohol, use of seatbelts, fire alarms in home, use of sunscreen).  Also reviewed age and gender appropriate health screening as well as vaccine recommendations. Vaccines: Tdap due->given today.  Shingrix->will get at f/u visit with me in 2 wks.  Otherwise ALL UTD. Labs:  fasting HP + Hba1c Prostate ca screening: he gets this via his urologist with [facility]. Colon ca screening: recall 2028.  An After Visit Summary was printed and given to the patient.  FOLLOW UP:  Return in about 2 weeks (around 05/02/2020) for f/u HTN. and get shingrix #1.  Signed:  07/02/2020, MD           04/18/2020

## 2020-04-18 NOTE — Addendum Note (Signed)
Addended by: Emi Holes D on: 04/18/2020 10:54 AM   Modules accepted: Orders

## 2020-04-18 NOTE — Patient Instructions (Signed)
Check your blood pressure and heart rate daily and write these numbers down so we can review them together.

## 2020-04-19 ENCOUNTER — Telehealth: Payer: Self-pay | Admitting: Family Medicine

## 2020-04-19 NOTE — Telephone Encounter (Signed)
Information added to result note for documentation.

## 2020-04-19 NOTE — Telephone Encounter (Signed)
Patient returned call for lab results and I relayed Dr. Samul Dada message as follows: "all labs came back normal. No new recommendations." Patient expressed happy understanding.

## 2020-05-19 ENCOUNTER — Ambulatory Visit: Payer: 59 | Admitting: Family Medicine

## 2020-05-19 DIAGNOSIS — Z0289 Encounter for other administrative examinations: Secondary | ICD-10-CM

## 2020-11-16 DIAGNOSIS — E559 Vitamin D deficiency, unspecified: Secondary | ICD-10-CM | POA: Insufficient documentation

## 2020-11-16 DIAGNOSIS — R7303 Prediabetes: Secondary | ICD-10-CM | POA: Insufficient documentation

## 2020-11-16 DIAGNOSIS — E785 Hyperlipidemia, unspecified: Secondary | ICD-10-CM | POA: Insufficient documentation

## 2020-11-17 ENCOUNTER — Other Ambulatory Visit: Payer: Self-pay

## 2020-11-17 ENCOUNTER — Encounter: Payer: Self-pay | Admitting: Family Medicine

## 2020-11-17 ENCOUNTER — Ambulatory Visit: Payer: 59 | Admitting: Family Medicine

## 2020-11-17 ENCOUNTER — Telehealth: Payer: Self-pay

## 2020-11-17 VITALS — BP 128/81 | HR 89 | Temp 97.9°F | Wt 234.6 lb

## 2020-11-17 DIAGNOSIS — R7303 Prediabetes: Secondary | ICD-10-CM

## 2020-11-17 DIAGNOSIS — E7800 Pure hypercholesterolemia, unspecified: Secondary | ICD-10-CM

## 2020-11-17 DIAGNOSIS — I10 Essential (primary) hypertension: Secondary | ICD-10-CM

## 2020-11-17 DIAGNOSIS — Z23 Encounter for immunization: Secondary | ICD-10-CM | POA: Diagnosis not present

## 2020-11-17 DIAGNOSIS — R1031 Right lower quadrant pain: Secondary | ICD-10-CM | POA: Diagnosis not present

## 2020-11-17 LAB — COMPREHENSIVE METABOLIC PANEL
ALT: 12 U/L (ref 0–53)
AST: 15 U/L (ref 0–37)
Albumin: 4.1 g/dL (ref 3.5–5.2)
Alkaline Phosphatase: 61 U/L (ref 39–117)
BUN: 16 mg/dL (ref 6–23)
CO2: 31 mEq/L (ref 19–32)
Calcium: 9.4 mg/dL (ref 8.4–10.5)
Chloride: 104 mEq/L (ref 96–112)
Creatinine, Ser: 1.13 mg/dL (ref 0.40–1.50)
GFR: 69.76 mL/min (ref >60.00)
Glucose, Bld: 77 mg/dL (ref 70–99)
Potassium: 3.6 mEq/L (ref 3.5–5.1)
Sodium: 142 mEq/L (ref 135–145)
Total Bilirubin: 0.6 mg/dL (ref 0.2–1.2)
Total Protein: 7 g/dL (ref 6.0–8.3)

## 2020-11-17 MED ORDER — AMLODIPINE BESY-BENAZEPRIL HCL 10-40 MG PO CAPS
1.0000 | ORAL_CAPSULE | Freq: Every day | ORAL | 3 refills | Status: DC
Start: 2020-11-17 — End: 2021-11-20

## 2020-11-17 MED ORDER — ROSUVASTATIN CALCIUM 20 MG PO TABS: ORAL_TABLET | ORAL | 3 refills | Status: DC

## 2020-11-17 NOTE — Telephone Encounter (Signed)
A user error has taken place: encounter opened in error, closed for administrative reasons.

## 2020-11-17 NOTE — Progress Notes (Signed)
OFFICE VISIT  11/17/2020  CC: f/u HTN, HLD, prediab   HPI:    Christopher Cobb is a 60 y.o. male who presents for 6 mo f/u HTN, HLD, prediabetes. A/P as of last visit: "1) HTN: not ideal control. Inc lotrel to 10-40 qd. Lytes/cr today. Monitor bp/hr at home daily and return for review with me in 2 wks.   2) HLD: tolerating rosuva 20mg  qd. FLP and hepatic panel today.   3) Prediabetes: encouraged increased/sustained efforts at [facility]. Hba1c today.   4) Health maintenance exam: Reviewed age and gender appropriate health maintenance issues (prudent diet, regular exercise, health risks of tobacco and excessive alcohol, use of seatbelts, fire alarms in home, use of sunscreen).  Also reviewed age and gender appropriate health screening as well as vaccine recommendations. Vaccines: Tdap due->given today.  Shingrix->will get at f/u visit with me in 2 wks.  Otherwise ALL UTD. Labs: fasting HP + Hba1c Prostate ca screening: he gets this via his urologist with [facility]. Colon ca screening: recall 2028."  INTERIM HX: He is feeling well. He has not restarted an exercise regimen yet, says "the treadmill is still in the box".  He has, however, made significant improvements in his diet and has lost 10 pounds over the last 6 months. No home blood pressure monitoring.  He reports compliance with Lotrel 10-40/day, rosuvastatin 20 mg/day.  ROS as above, plus--> no fevers, no CP, no SOB, no wheezing, no cough, no dizziness, no HAs, no rashes, no melena/hematochezia.  No polyuria or polydipsia.  No myalgias or arthralgias.  No focal weakness, paresthesias, or tremors.  No acute vision or hearing abnormalities.  No dysuria or unusual/new urinary urgency or frequency.  No recent changes in lower legs. No n/v/d.  He notes occasional pain in right lower quadrant.  Says it is mild in intensity, not associated with any other symptoms.  Eating does not trigger it or relieve it.  Unclear how long it has been giving him any  problem.  No palpitations.     Past Medical History:  Diagnosis Date   Atherosclerosis of native artery of extremity (HCC)    BPH with obstruction/lower urinary tract symptoms    [facility] urol is following pt and checking annual PSA's (most recent visit with them was 11/2017)   GERD (gastroesophageal reflux disease)    Hemorrhoids    Hydrocele, bilateral    + epididymal cyst--->urol following ([facility]).   Hypercholesterolemia    Hypertension    Microscopic hematuria 2014   u/s and cystoscopy normal.  No recurrence.  No gross hematuria.   Prediabetes    Prostate cancer screening 12/22/2017   PSA screening is done by his urologist   PVD (peripheral vascular disease) (HCC)    Right ureteral stone 05/2011   Vitamin D deficiency     Past Surgical History:  Procedure Laterality Date   COLONOSCOPY  02/05/2008; 2018   2010 ->for rectal bleeding: normal (presumed cause was hemorrhoids). ([facility]).  Repeat 2018 ("somewhere in [place]")- normal except hemorrhoids.  Recall 2028.   KNEE SURGERY     SHOULDER SURGERY      Outpatient Medications Prior to Visit  Medication Sig Dispense Refill   amLODipine-benazepril (LOTREL) 10-40 MG capsule Take 1 capsule by mouth daily. 30 capsule 1   aspirin EC 81 MG tablet Take 1 tablet (81 mg total) by mouth daily. 30 tablet 0   rosuvastatin (CRESTOR) 20 MG tablet TAKE 1 TABLET(20 MG) BY MOUTH DAILY 90 tablet 1   No  facility-administered medications prior to visit.    Allergies  Allergen Reactions   Hctz [Hydrochlorothiazide] Other (See Comments)    ED    ROS As per HPI  PE: Vitals with BMI 11/17/2020 04/18/2020 10/26/2019  Height - 5\' 10"  -  Weight 234 lbs 10 oz 243 lbs 6 oz -  BMI - 34.92 -  Systolic 128 140  Diastolic 81 90 90  Pulse 89 77 -    Gen: Alert, well appearing.  Christopher Cobb is oriented to person, place, time, and situation. AFFECT: pleasant, lucid thought and speech. CV: RRR, no m/r/g.   LUNGS: CTA bilat, nonlabored  resps, good aeration in all lung fields. ABD: soft, NT, ND, BS normal.  No hepatospenomegaly or mass.  No bruits.  LABS:  Lab Results  Component Value Date   TSH 1.30 04/18/2020   Lab Results  Component Value Date   WBC 4.4 04/18/2020   HGB 15.0 04/18/2020   HCT 45.9 04/18/2020   MCV 86.5 04/18/2020   PLT 191.0 04/18/2020   Lab Results  Component Value Date   CREATININE 1.00 04/18/2020   BUN 11 04/18/2020   NA 142 04/18/2020   K 4.0 04/18/2020   CL 105 04/18/2020   CO2 33 (H) 04/18/2020   Lab Results  Component Value Date   ALT 16 04/18/2020   AST 17 04/18/2020   ALKPHOS 57 04/18/2020   BILITOT 0.6 04/18/2020   Lab Results  Component Value Date   CHOL 150 04/18/2020   Lab Results  Component Value Date   HDL 52.10 04/18/2020   Lab Results  Component Value Date   LDLCALC 86 04/18/2020   Lab Results  Component Value Date   TRIG 62.0 04/18/2020   Lab Results  Component Value Date   CHOLHDL 3 04/18/2020   Lab Results  Component Value Date   PSA 1.48 11/11/2017   PSA 1.3 10/31/2015   PSA 0.65 01/07/2008   Lab Results  Component Value Date   HGBA1C 5.4 04/18/2020    IMPRESSION AND PLAN:  1) HTN: well controlled on Lotrel 10-40/day. Lytes/cr today.  2) HLD: Tolerating rosuvastatin 20 mg a day.  He is not fasting today. Lipid panel in 6 months. Hepatic panel today.  3) Prediabetes: encouraged increased/sustained efforts at [facility]. Most recent a1c 6 mo ago stable at 5.4%. Hba1c at f/u in 6 mo.  4) right lower quadrant pain, intermittent, mild and unassociated with any other symptoms.  Exam normal today.  Observation.  Reassurance. Signs/symptoms to call or return for were reviewed and pt expressed understanding.  An After Visit Summary was printed and given to the Christopher Cobb.  FOLLOW UP: Return in about 6 months (around 05/18/2021) for annual CPE (fasting). Cpe 03/2021  Signed:  04/2021, MD           11/17/2020

## 2020-12-06 ENCOUNTER — Telehealth: Payer: Self-pay

## 2020-12-06 NOTE — Telephone Encounter (Signed)
Signed and put in box to go up front. Signed:  Phil Neita Landrigan, MD           02/16/2020  

## 2020-12-06 NOTE — Telephone Encounter (Signed)
Placed on PCP desk to review and sign, if appropriate.  

## 2020-12-06 NOTE — Telephone Encounter (Signed)
Received form from patient to completed and signed by Dr. Milinda Cave.  Please call when ready for pick up.  Gave form to [facility] 12/06/20.  Employee Wellness Biometric Screening Form

## 2020-12-12 NOTE — Telephone Encounter (Signed)
Patient called regarding wellness form.  Faxed to company per patient request.  Patient requested to send to patient email on profile, sent per request.

## 2021-01-12 DIAGNOSIS — K648 Other hemorrhoids: Secondary | ICD-10-CM | POA: Insufficient documentation

## 2021-02-19 ENCOUNTER — Ambulatory Visit: Payer: 59 | Admitting: Family Medicine

## 2021-02-19 ENCOUNTER — Other Ambulatory Visit: Payer: Self-pay

## 2021-02-19 ENCOUNTER — Encounter: Payer: Self-pay | Admitting: Family Medicine

## 2021-02-19 ENCOUNTER — Telehealth: Payer: Self-pay

## 2021-02-19 VITALS — BP 127/82 | HR 97 | Temp 98.5°F | Ht 70.0 in | Wt 235.4 lb

## 2021-02-19 DIAGNOSIS — K644 Residual hemorrhoidal skin tags: Secondary | ICD-10-CM

## 2021-02-19 DIAGNOSIS — K625 Hemorrhage of anus and rectum: Secondary | ICD-10-CM

## 2021-02-19 MED ORDER — HYDROCORTISONE (PERIANAL) 2.5 % EX CREA: 1.0000 "application " | TOPICAL_CREAM | Freq: Two times a day (BID) | CUTANEOUS | 1 refills | Status: DC

## 2021-02-19 NOTE — Patient Instructions (Signed)
Buy over the counter generic Senakot-S, take 2 tabs every other day to prevent any significant constipation.

## 2021-02-19 NOTE — Progress Notes (Signed)
OFFICE VISIT  02/19/2021  CC:  Chief Complaint  Patient presents with   Blood in stool    X3-4 days, significant amount of bright red blood.     Patient is a 61 y.o. male who presents for "blood in stool".  HPI:    Past Medical History:  Diagnosis Date   Atherosclerosis of native artery of extremity ([place])    BPH with obstruction/lower urinary tract symptoms    [facility] urol is following pt and checking annual PSA's (most recent visit with them was 11/2017)   GERD (gastroesophageal reflux disease)    Hemorrhoids    Hydrocele, bilateral    + epididymal cyst--->urol following ([facility]).   Hypercholesterolemia    Hypertension    Microscopic hematuria 2014   u/s and cystoscopy normal.  No recurrence.  No gross hematuria.   Prediabetes    Prostate cancer screening 12/22/2017   PSA screening is done by his urologist   PVD (peripheral vascular disease) ([place])    Right ureteral stone 05/2011   Vitamin D deficiency     Past Surgical History:  Procedure Laterality Date   COLONOSCOPY  02/05/2008; 2018   2010 ->for rectal bleeding: normal (presumed cause was hemorrhoids). ([facility]).  Repeat 2018 ("somewhere in [place]")- normal except hemorrhoids.  Recall 2028.   KNEE SURGERY     SHOULDER SURGERY      Outpatient Medications Prior to Visit  Medication Sig Dispense Refill   amLODipine-benazepril (LOTREL) 10-40 MG capsule Take 1 capsule by mouth daily. 90 capsule 3   aspirin EC 81 MG tablet Take 1 tablet (81 mg total) by mouth daily. 30 tablet 0   rosuvastatin (CRESTOR) 20 MG tablet TAKE 1 TABLET(20 MG) BY MOUTH DAILY 90 tablet 3   No facility-administered medications prior to visit.    Allergies  Allergen Reactions   Hctz [Hydrochlorothiazide] Other (See Comments)    ED    ROS As per HPI  PE: Vitals with BMI 02/19/2021 11/17/2020 04/18/2020  Height 5\' 10"  - 5\' 10"   Weight 235 lbs 6 oz 234 lbs 10 oz 243 lbs 6 oz  BMI 0000000 - 123456  Systolic [id number] 0000000 [id number]  Diastolic 82  81 90  Pulse 97 89 77     Physical Exam    LABS:  Last CBC Lab Results  Component Value Date   WBC 4.4 04/18/2020   HGB 15.0 04/18/2020   HCT 45.9 04/18/2020   MCV 86.5 04/18/2020   MCH 28.5 06/25/2011   RDW 13.6 04/18/2020   PLT 191.0 123456   Last metabolic panel Lab Results  Component Value Date   GLUCOSE 77 11/17/2020   NA 142 11/17/2020   K 3.6 11/17/2020   CL 104 11/17/2020   CO2 31 11/17/2020   BUN 16 11/17/2020   CREATININE 1.13 11/17/2020   GFRNONAA 77 (L) 06/25/2011   CALCIUM 9.4 11/17/2020   PROT 7.0 11/17/2020   ALBUMIN 4.1 11/17/2020   BILITOT 0.6 11/17/2020   ALKPHOS 61 11/17/2020   AST 15 11/17/2020   ALT 12 11/17/2020   IMPRESSION AND PLAN:  No problem-specific Assessment & Plan notes found for this encounter.   An After Visit Summary was printed and given to the patient.  FOLLOW UP: No follow-ups on file.  Signed:  Crissie Sickles, MD           02/19/2021

## 2021-02-19 NOTE — Progress Notes (Addendum)
OFFICE VISIT  02/19/2021  CC:  Chief Complaint  Patient presents with   Blood in stool    X3-4 days, significant amount of bright red blood.    Patient is a 61 y.o. male who presents for "blood in stool".  HPI:  Patient presents today with bright, red blood in the stool that he first noticed 3-4 days ago. Patient describes the amount of blood covering 1/2 of his stool. Also it was mentioned the patient feels pain whenever he strains during his bowel movement and that he has a history of hemorrhoids.  Described as soreness but not tearing/severe pain. He rates his pain a 2/10 and he does not take anything for discomfort.  Has fairly regularly occurring bouts of hard BMs, occ takes dulcolax.  Hx of BRBPR in the past but usually just 1d at the most. See [place] section for details on prior rectal bleeding eval.  ROS negative for abdominal pain, cough, congestion, fever.  Past Medical History:  Diagnosis Date   Atherosclerosis of native artery of extremity ([place])    BPH with obstruction/lower urinary tract symptoms    [facility] u[facility] is following pt and checking annual PSA's (most recent visit with them was 11/2017)   GERD (gastroesophageal reflux disease)    Hemorrhoids    Hydrocele, bilateral    + epididymal cyst--->urol following ([facility]).   Hypercholesterolemia    Hypertension    Microscopic hematuria 2014   u/s and cystoscopy normal.  No recurrence.  No gross hematuria.   Prediabetes    Prostate cancer screening 12/22/2017   PSA screening is done by his urologist   PVD (peripheral vascular disease) ([place])    Right ureteral stone 05/2011   Vitamin D deficiency     Past Surgical History:  Procedure Laterality Date   COLONOSCOPY  02/05/2008; 2018   2010 ->for rectal bleeding: normal (presumed cause was hemorrhoids). ([facility]).  Repeat 2018 ("somewhere in [place]")- normal except hemorrhoids.  Recall 2028.   KNEE SURGERY     SHOULDER SURGERY      Outpatient Medications Prior to  Visit  Medication Sig Dispense Refill   amLODipine-benazepril (LOTREL) 10-40 MG capsule Take 1 capsule by mouth daily. 90 capsule 3   aspirin EC 81 MG tablet Take 1 tablet (81 mg total) by mouth daily. 30 tablet 0   rosuvastatin (CRESTOR) 20 MG tablet TAKE 1 TABLET(20 MG) BY MOUTH DAILY 90 tablet 3   No facility-administered medications prior to visit.    Allergies  Allergen Reactions   Hctz [Hydrochlorothiazide] Other (See Comments)    ED    ROS As per HPI  PE: Vitals with BMI 02/19/2021 11/17/2020 04/18/2020  Height 5\' 10"  - 5\' 10"   Weight 235 lbs 6 oz 234 lbs 10 oz 243 lbs 6 oz  BMI 0000000 - 123456  Systolic [id number] 0000000 [id number]  Diastolic 82 81 90  Pulse 97 89 77     Physical Exam Constitutional:      Appearance: Normal appearance.  Cardiovascular:     Rate and Rhythm: Normal rate and regular rhythm.  Pulmonary:     Effort: Pulmonary effort is normal.     Breath sounds: Normal breath sounds.  Abdominal:     General: Abdomen is flat. Bowel sounds are normal.     Palpations: Abdomen is soft.  Genitourinary:    Rectum: Normal.  Neurological:     Mental Status: He is alert.    LABS:  Last CBC Lab Results  Component Value Date  WBC 4.4 04/18/2020   HGB 15.0 04/18/2020   HCT 45.9 04/18/2020   MCV 86.5 04/18/2020   MCH 28.5 06/25/2011   RDW 13.6 04/18/2020   PLT 191.0 123456   Last metabolic panel Lab Results  Component Value Date   GLUCOSE 77 11/17/2020   NA 142 11/17/2020   K 3.6 11/17/2020   CL 104 11/17/2020   CO2 31 11/17/2020   BUN 16 11/17/2020   CREATININE 1.13 11/17/2020   GFRNONAA 77 (L) 06/25/2011   CALCIUM 9.4 11/17/2020   PROT 7.0 11/17/2020   ALBUMIN 4.1 11/17/2020   BILITOT 0.6 11/17/2020   ALKPHOS 61 11/17/2020   AST 15 11/17/2020   ALT 12 11/17/2020   IMPRESSION AND PLAN:  Non-toxic appearing patient who presents today for blood in still. Given the patients straining, pain, history of hemorrhoids and bright red blood during his  bowel movements, in the setting of a negative rectal exam,  it is suspected that external hemorrhoids are the cause for this patients symptoms.  Unremarkable abdominal exam. Patient prescribed rectal Hydrocortisone 2.5% for symptoms relief and was recommended Senna to soften his stools for symptom relief. Discussed the possibility of internal hemorrhoids and/or polyp as the cause of his bleeding but feel lower suspicion of these. His wife has already made him an appt with the GI MD who saw him for BRBPR in 2018.  An After Visit Summary was printed and given to the patient.  FOLLOW UP: Return if symptoms worsen or fail to improve.  Phil Dopp - MS3  Signed:  Crissie Sickles, MD           02/19/2021

## 2021-02-19 NOTE — Telephone Encounter (Signed)
FYI, pt had appt today with provider.  [facility] Day - Client TELEPHONE ADVICE RECORD [facility] Patient Name: RIYAN HAILE MS Gender: Male DOB: [birth year] Age: 61 Y 6 M 19 D Return Phone Number: [phone] (Primary) Address: City/ State/ Zip: [place]  [address] Client [facility] Day - Client Client Site [facility] - Day Provider Santiago Bumpers - MD Contact Type Call Who Is Calling Patient / Member / Family / Caregiver Call Type Triage / Clinical Relationship To Patient Self Return Phone Number [phone] (Primary) Chief Complaint Blood In Stool Reason for Call Symptomatic / Request for Health Information Initial Comment Caller states patient has blood in stool, scheduled today at 3PM, wants to know if need to be seen sooner, will need callback as he was not on the line Translation No Nurse Assessment Nurse: Emilee Hero, RN, Bjorn Loser Date/Time (Eastern Time): 02/19/2021 11:03:51 AM Confirm and document reason for call. If symptomatic, describe symptoms. ---Caller states patient has blood in stool for the past 3-4 days, scheduled today at 3PM, wants to know if need to be seen sooner, will need callback as he was not on the line. Does the patient have any new or worsening symptoms? ---Yes Will a triage be completed? ---Yes Related visit to physician within the last 2 weeks? ---No Does the PT have any chronic conditions? (i.e. diabetes, asthma, this includes High risk factors for pregnancy, etc.) ---Yes List chronic conditions. ---HTN; Is this a behavioral health or substance abuse call? ---No Guidelines Guideline Title Affirmed Question Affirmed Notes Nurse Date/Time [name] Rectal Bleeding MODERATE rectal bleeding (small blood clots, passing blood without stool, or toilet water turns red) Emilee Hero, RN, Bjorn Loser 02/19/2021 11:06:03 AM Disp. Time [name] Disposition Final  User 02/19/2021 11:11:43 AM See PCP within 24 Hours Yes Emilee Hero, RN, Bjorn Loser PLEASE NOTE: All timestamps contained within this report are represented as [place] Standard Time. CONFIDENTIALTY NOTICE: This fax transmission is intended only for the addressee. It contains information that is legally privileged, confidential or otherwise protected from use or disclosure. If you are not the intended recipient, you are strictly prohibited from reviewing, disclosing, copying using or disseminating any of this information or taking any action in reliance on or regarding this information. If you have received this fax in error, please notify us immediately by telephone so that we can arrange for its return to [place]. Phone: [phone], Toll-Free: [phone], Fax: [phone] Page: 2 of 2 Call Id: [id number] Caller Disagree/Comply Comply Caller Understands Yes PreDisposition Call Doctor Care Advice Given Per Guideline SEE PCP WITHIN 24 HOURS: * IF OFFICE WILL BE OPEN: You need to be examined within the next 24 hours. Call your doctor (or NP/PA) when the office opens and make an appointment. BRING MEDICINES: * Please bring a list of your current medicines when you go to see the doctor. * It is also a good idea to bring the pill bottles too. This will help the doctor to make certain you are taking the right medicines and the right dose. * Bleeding increases * Dizziness occurs * You become worse CALL BACK IF: CARE ADVICE given per Rectal Bleeding (Adult) guideline. Comments User: Evon Slack, RN Date/Time [name]: 02/19/2021 11:11:37 AM Caller reports his last episode of bloody stools was yesterday; he has not had any bloody stools today. Advised to callback should this occur prior to his appt; he will need to be seen in the ED for any worsening s/s.  Referrals REFERRED TO PCP OFFICE

## 2021-02-21 ENCOUNTER — Ambulatory Visit: Payer: 59 | Admitting: Family Medicine

## 2021-03-09 ENCOUNTER — Telehealth: Payer: Self-pay

## 2021-03-09 ENCOUNTER — Telehealth: Payer: Self-pay | Admitting: Family Medicine

## 2021-03-09 NOTE — Telephone Encounter (Signed)
Patient refill request.   [facility]  amLODipine-benazepril (LOTREL) 10-40 MG capsule [id number]

## 2021-03-09 NOTE — Telephone Encounter (Addendum)
Unable to LVM. Medication still has refills from 10/2020 prescription written for 1 year supply at [facility] on [address]

## 2021-03-13 NOTE — Telephone Encounter (Signed)
Pt advised refill available.

## 2021-05-07 NOTE — Telephone Encounter (Signed)
error 

## 2021-09-06 ENCOUNTER — Telehealth: Payer: Self-pay

## 2021-09-06 NOTE — Telephone Encounter (Signed)
Pt scheduled with provider 8/11  [facility] Day - Client TELEPHONE ADVICE RECORD [facility] Patient Name: Christopher Cobb [facility] MS Gender: Male DOB: December 13, 1960 Age: 61 Y 1 M 6 D Return Phone Number: [phone] (Primary) Address: City/ State/ Zip: [place]  [address] Client [facility] Day - Client Client Site [facility] - Day Provider Santiago Bumpers - MD Contact Type Call Who Is Calling Patient / Member / Family / Caregiver Call Type Triage / Clinical Relationship To Patient Self Return Phone Number [phone] (Primary) Chief Complaint CHEST PAIN - pain, pressure, heaviness or tightness Reason for Call Symptomatic / Request for Health Information Initial Comment Caller was transferred from the office to speak with triage due to chest pains and headaches Translation No Nurse Assessment Nurse: Daphine Deutscher, RN, Cala Bradford Date/Time (Eastern Time): 09/06/2021 8:27:14 AM Confirm and document reason for call. If symptomatic, describe symptoms. ---caller states he has intermittent sharp chest pains and a headache. no fever. Does the patient have any new or worsening symptoms? ---Yes Will a triage be completed? ---Yes Related visit to physician within the last 2 weeks? ---No Does the PT have any chronic conditions? (i.e. diabetes, asthma, this includes High risk factors for pregnancy, etc.) ---No Is this a behavioral health or substance abuse call? ---No Guidelines Guideline Title Affirmed Question Affirmed Notes Nurse Date/Time [name] Chest Pain [1] Chest pain lasts < 5 minutes AND [2] NO chest pain or cardiac symptoms (e.g., breathing difficulty, sweating) now (Exception: Chest pains that last only a few seconds.) Daphine Deutscher, RN, Cala Bradford 09/06/2021 8:28:08 AM Disp. Time [name] Disposition Final User 09/06/2021 8:19:09 AM Send to Urgent Michela Pitcher, Lanette PLEASE NOTE: All timestamps  contained within this report are represented as [place] Standard Time. CONFIDENTIALTY NOTICE: This fax transmission is intended only for the addressee. It contains information that is legally privileged, confidential or otherwise protected from use or disclosure. If you are not the intended recipient, you are strictly prohibited from reviewing, disclosing, copying using or disseminating any of this information or taking any action in reliance on or regarding this information. If you have received this fax in error, please notify us immediately by telephone so that we can arrange for its return to [place]. Phone: [phone], Toll-Free: [phone], Fax: [phone] Page: 2 of 2 Call Id: [id number] Disp. Time [name] Disposition Final User 09/06/2021 8:32:06 AM See PCP within 24 Hours Yes Daphine Deutscher RN, Cala Bradford Final Disposition 09/06/2021 8:32:06 AM See PCP within 24 Hours Yes Daphine Deutscher, RN, Anders Simmonds Disagree/Comply Comply Caller Understands Yes PreDisposition Call Doctor Care Advice Given Per Guideline SEE PCP WITHIN 24 HOURS: * IF OFFICE WILL BE OPEN: You need to be examined within the next 24 hours. Call your doctor (or NP/PA) when the office opens and make an appointment. CALL BACK IF: * Difficulty breathing occurs * Chest pain increases in frequency, duration or severity * Chest pain lasts over 5 minutes * You become worse CARE ADVICE given per Chest Pain (Adult) guideline. Referrals REFERRED TO PCP OFFICE

## 2021-09-07 ENCOUNTER — Ambulatory Visit: Payer: 59 | Admitting: Family Medicine

## 2021-09-07 ENCOUNTER — Encounter: Payer: Self-pay | Admitting: Family Medicine

## 2021-09-07 VITALS — BP 133/85 | HR 70 | Temp 98.4°F | Ht 70.0 in | Wt 239.6 lb

## 2021-09-07 DIAGNOSIS — R079 Chest pain, unspecified: Secondary | ICD-10-CM | POA: Diagnosis not present

## 2021-09-07 DIAGNOSIS — F321 Major depressive disorder, single episode, moderate: Secondary | ICD-10-CM | POA: Diagnosis not present

## 2021-09-07 MED ORDER — CITALOPRAM HYDROBROMIDE 20 MG PO TABS: 20.0000 mg | ORAL_TABLET | Freq: Every day | ORAL | 0 refills | Status: DC

## 2021-09-07 NOTE — Patient Instructions (Signed)
Take over-the-counter Pepcid (generic ok) as directed on the packaging for 2 weeks.

## 2021-09-07 NOTE — Progress Notes (Signed)
OFFICE VISIT  09/07/2021  CC:  Chief Complaint  Patient presents with   Headache    Pt taken Tylenol; last dose was this morning. Pt denies sensitivity to light or blurry vision. Denies feeling lightheaded or dizzy   Back Pain    Neck to lower back; pt has not really done any at home treatment for this.   Chest Pain    Slight chest pain; pt states located mid chest area for 2-3 days, it comes and goes. Per triage nurse 8/10; slight intermittent chest pain. Pt described today as nagging.     Patient is a 61 y.o. male who presents for depression.  HPI: North is really feeling down. He tells me his daughter lost her fianc about 6 months ago.  He was apparently found after having had a sudden heart attack. Obviously this has made her depressed and it has persisted and the patient has felt similar depression that is worsening over the last couple weeks. His daughter lives with him and he has to see her struggle every day. He has trouble sleeping, anhedonia, poor concentration, trouble making clear decisions, crying spells, increased anxiety.  No suicidal or homicidal ideation.  No hallucinations or delusions.  At times when his symptoms build significantly he feels some persistent headache that feels like "a blanket on my head".  Has some substernal and epigastric burning sensation that waxes and wanes for a few hours.  No exertional chest pain, no shortness of breath, no diaphoresis, no palpitations, no dizziness, no nausea.  He is having a lot of trouble with his work responsibilities due to his symptoms.  ROS as above, plus--> no fevers,  no wheezing, no cough, no melena/hematochezia.  No polyuria or polydipsia.  No myalgias or arthralgias other than some generalized back pain at times.  No focal weakness, paresthesias, or tremors.  No acute vision or hearing abnormalities.  No dysuria or unusual/new urinary urgency or frequency.  No recent changes in lower legs. No n/v/d or abd pain.    Past Medical History:  Diagnosis Date   Atherosclerosis of native artery of extremity ([facility])    BPH with obstruction/lower urinary tract symptoms    [facility] urol is following pt and checking annual PSA's (most recent visit with them was 11/2017)   GERD (gastroesophageal reflux disease)    Hemorrhoids    Hydrocele, bilateral    + epididymal cyst--->urol following ([facility]).   Hypercholesterolemia    Hypertension    Microscopic hematuria 2014   u/s and cystoscopy normal.  No recurrence.  No gross hematuria.   Prediabetes    Prostate cancer screening 12/22/2017   PSA screening is done by his urologist   PVD (peripheral vascular disease) ([facility])    Right ureteral stone 05/2011   Vitamin D deficiency     Past Surgical History:  Procedure Laterality Date   COLONOSCOPY  02/05/2008; 2018   2010 ->for rectal bleeding: normal (presumed cause was hemorrhoids). ([facility]).  Repeat 2018 ("somewhere in [employer]")- normal except hemorrhoids.  Recall 2028.   KNEE SURGERY     SHOULDER SURGERY      Outpatient Medications Prior to Visit  Medication Sig Dispense Refill   amLODipine-benazepril (LOTREL) 10-40 MG capsule Take 1 capsule by mouth daily. 90 capsule 3   aspirin EC 81 MG tablet Take 1 tablet (81 mg total) by mouth daily. 30 tablet 0   rosuvastatin (CRESTOR) 20 MG tablet TAKE 1 TABLET(20 MG) BY MOUTH DAILY 90 tablet 3   hydrocortisone (  ANUSOL-HC) 2.5 % rectal cream Place 1 application rectally 2 (two) times daily. 1 application twice per day as needed for hemorrhoids (Patient not taking: Reported on 09/07/2021) 30 g 1   No facility-administered medications prior to visit.    Allergies  Allergen Reactions   Hctz [Hydrochlorothiazide] Other (See Comments)    ED    ROS As per HPI  PE:    09/07/2021    9:58 AM 02/19/2021    3:13 PM 11/17/2020    1:09 PM  Vitals with BMI  Height 5\' 10"  5\' 10"    Weight 239 lbs 10 oz 235 lbs 6 oz 234 lbs 10 oz  BMI 34.38 33.78   Systolic 133 127  128  Diastolic 85 82 81  Pulse 70 97 89     Physical Exam  Gen: Alert, well appearing.  Patient is oriented to person, place, time, and situation. Affect: Depressed, speaks softly, tearful frequently.  His thought and speech are lucid.  LABS:  Last CBC Lab Results  Component Value Date   WBC 4.4 04/18/2020   HGB 15.0 04/18/2020   HCT 45.9 04/18/2020   MCV 86.5 04/18/2020   MCH 28.5 06/25/2011   RDW 13.6 04/18/2020   PLT 191.0 04/18/2020   Last metabolic panel Lab Results  Component Value Date   GLUCOSE 77 11/17/2020   NA 142 11/17/2020   K 3.6 11/17/2020   CL 104 11/17/2020   CO2 31 11/17/2020   BUN 16 11/17/2020   CREATININE 1.13 11/17/2020   CALCIUM 9.4 11/17/2020   PROT 7.0 11/17/2020   ALBUMIN 4.1 11/17/2020   BILITOT 0.6 11/17/2020   ALKPHOS 61 11/17/2020   AST 15 11/17/2020   ALT 12 11/17/2020   Last lipids Lab Results  Component Value Date   CHOL 150 04/18/2020   HDL 52.10 04/18/2020   LDLCALC 86 04/18/2020   LDLDIRECT 168.0 01/07/2008   TRIG 62.0 04/18/2020   CHOLHDL 3 04/18/2020   Last hemoglobin A1c Lab Results  Component Value Date   HGBA1C 5.4 04/18/2020   IMPRESSION AND PLAN:  #1 major depressive disorder. Start citalopram 20 mg daily.  Therapeutic expectations and side effect profile of medication discussed today.  Patient's questions answered. Discussed counseling today and he said that if the next week off work does not help him significantly then we will take the step. Due to his severe depression plus the need to be with his daughter to help her recover in the next week I will write a note stating that he needs this week off from work due to his health.  #2 GERD and likely acute gastritis due to stress. He is just the foods he eats to avoid significant reflux.  When he has breakthrough he takes Tums and it helps. I recommended he take over-the-counter Pepcid twice a day for the next 2 weeks.  An After Visit Summary was printed and  given to the patient.  FOLLOW UP: Return for 3-4 wks mood/anx.  Signed:  04/20/2020, MD           09/07/2021

## 2021-11-20 ENCOUNTER — Other Ambulatory Visit: Payer: Self-pay

## 2021-11-20 MED ORDER — ROSUVASTATIN CALCIUM 20 MG PO TABS: ORAL_TABLET | ORAL | 0 refills | Status: DC

## 2021-11-20 MED ORDER — AMLODIPINE BESY-BENAZEPRIL HCL 10-40 MG PO CAPS: 1.0000 | ORAL_CAPSULE | Freq: Every day | ORAL | 0 refills | Status: DC

## 2021-12-04 NOTE — Patient Instructions (Signed)
Health Maintenance, Male Adopting a healthy lifestyle and getting preventive care are important in promoting health and wellness. Ask your health care provider about: The right schedule for you to have regular tests and exams. Things you can do on your own to prevent diseases and keep yourself healthy. What should I know about diet, weight, and exercise? Eat a healthy diet  Eat a diet that includes plenty of vegetables, fruits, low-fat dairy products, and lean protein. Do not eat a lot of foods that are high in solid fats, added sugars, or sodium. Maintain a healthy weight Body mass index (BMI) is a measurement that can be used to identify possible weight problems. It estimates body fat based on height and weight. Your health care provider can help determine your BMI and help you achieve or maintain a healthy weight. Get regular exercise Get regular exercise. This is one of the most important things you can do for your health. Most adults should: Exercise for at least 150 minutes each week. The exercise should increase your heart rate and make you sweat (moderate-intensity exercise). Do strengthening exercises at least twice a week. This is in addition to the moderate-intensity exercise. Spend less time sitting. Even light physical activity can be beneficial. Watch cholesterol and blood lipids Have your blood tested for lipids and cholesterol at 63 years of age, then have this test every 5 years. You may need to have your cholesterol levels checked more often if: Your lipid or cholesterol levels are high. You are older than 63 years of age. You are at high risk for heart disease. What should I know about cancer screening? Many types of cancers can be detected early and may often be prevented. Depending on your health history and family history, you may need to have cancer screening at various ages. This may include screening for: Colorectal cancer. Prostate cancer. Skin cancer. Lung  cancer. What should I know about heart disease, diabetes, and high blood pressure? Blood pressure and heart disease High blood pressure causes heart disease and increases the risk of stroke. This is more likely to develop in people who have high blood pressure readings or are overweight. Talk with your health care provider about your target blood pressure readings. Have your blood pressure checked: Every 3-5 years if you are 18-39 years of age. Every year if you are 40 years old or older. If you are between the ages of 65 and 75 and are a current or former smoker, ask your health care provider if you should have a one-time screening for abdominal aortic aneurysm (AAA). Diabetes Have regular diabetes screenings. This checks your fasting blood sugar level. Have the screening done: Once every three years after age 45 if you are at a normal weight and have a low risk for diabetes. More often and at a younger age if you are overweight or have a high risk for diabetes. What should I know about preventing infection? Hepatitis B If you have a higher risk for hepatitis B, you should be screened for this virus. Talk with your health care provider to find out if you are at risk for hepatitis B infection. Hepatitis C Blood testing is recommended for: Everyone born from 1945 through 1965. Anyone with known risk factors for hepatitis C. Sexually transmitted infections (STIs) You should be screened each year for STIs, including gonorrhea and chlamydia, if: You are sexually active and are younger than 63 years of age. You are older than 63 years of age and your   health care provider tells you that you are at risk for this type of infection. Your sexual activity has changed since you were last screened, and you are at increased risk for chlamydia or gonorrhea. Ask your health care provider if you are at risk. Ask your health care provider about whether you are at high risk for HIV. Your health care provider  may recommend a prescription medicine to help prevent HIV infection. If you choose to take medicine to prevent HIV, you should first get tested for HIV. You should then be tested every 3 months for as long as you are taking the medicine. Follow these instructions at home: Alcohol use Do not drink alcohol if your health care provider tells you not to drink. If you drink alcohol: Limit how much you have to 0-2 drinks a day. Know how much alcohol is in your drink. In the [place] one drink equals one 12 oz bottle of beer (355 mL), one 5 oz glass of wine (148 mL), or one 1 oz glass of hard liquor (44 mL). Lifestyle Do not use any products that contain nicotine or tobacco. These products include cigarettes, chewing tobacco, and vaping devices, such as e-cigarettes. If you need help quitting, ask your health care provider. Do not use street drugs. Do not share needles. Ask your health care provider for help if you need support or information about quitting drugs. General instructions Schedule regular health, dental, and eye exams. Stay current with your vaccines. Tell your health care provider if: You often feel depressed. You have ever been abused or do not feel safe at home. Summary Adopting a healthy lifestyle and getting preventive care are important in promoting health and wellness. Follow your health care provider's instructions about healthy diet, exercising, and getting tested or screened for diseases. Follow your health care provider's instructions on monitoring your cholesterol and blood pressure. This information is not intended to replace advice given to you by your health care provider. Make sure you discuss any questions you have with your health care provider. Document Revised: 06/05/2020 Document Reviewed: 06/05/2020 Elsevier Patient Education  2023 Elsevier Inc.  

## 2021-12-06 ENCOUNTER — Ambulatory Visit (INDEPENDENT_AMBULATORY_CARE_PROVIDER_SITE_OTHER): Payer: 59 | Admitting: Family Medicine

## 2021-12-06 ENCOUNTER — Encounter: Payer: Self-pay | Admitting: Family Medicine

## 2021-12-06 VITALS — BP 155/94 | HR 63 | Temp 97.8°F | Ht 70.0 in | Wt 247.0 lb

## 2021-12-06 DIAGNOSIS — Z0000 Encounter for general adult medical examination without abnormal findings: Secondary | ICD-10-CM | POA: Diagnosis not present

## 2021-12-06 DIAGNOSIS — F325 Major depressive disorder, single episode, in full remission: Secondary | ICD-10-CM

## 2021-12-06 DIAGNOSIS — R7303 Prediabetes: Secondary | ICD-10-CM

## 2021-12-06 DIAGNOSIS — Z23 Encounter for immunization: Secondary | ICD-10-CM

## 2021-12-06 DIAGNOSIS — E7800 Pure hypercholesterolemia, unspecified: Secondary | ICD-10-CM

## 2021-12-06 DIAGNOSIS — I10 Essential (primary) hypertension: Secondary | ICD-10-CM | POA: Diagnosis not present

## 2021-12-06 LAB — CBC
HCT: 44.1 % (ref 39.0–52.0)
Hemoglobin: 14.4 g/dL (ref 13.0–17.0)
MCHC: 32.6 g/dL (ref 30.0–36.0)
MCV: 87.4 fl (ref 78.0–100.0)
Platelets: 207 10*3/uL (ref 150.0–400.0)
RBC: 5.04 Mil/uL (ref 4.22–5.81)
RDW: 13.8 % (ref 11.5–15.5)
WBC: 3.4 10*3/uL — ABNORMAL LOW (ref 4.0–10.5)

## 2021-12-06 LAB — COMPREHENSIVE METABOLIC PANEL
ALT: 13 U/L (ref 0–53)
AST: 13 U/L (ref 0–37)
Albumin: 3.9 g/dL (ref 3.5–5.2)
Alkaline Phosphatase: 53 U/L (ref 39–117)
BUN: 12 mg/dL (ref 6–23)
CO2: 32 mEq/L (ref 19–32)
Calcium: 8.8 mg/dL (ref 8.4–10.5)
Chloride: 105 mEq/L (ref 96–112)
Creatinine, Ser: 0.9 mg/dL (ref 0.40–1.50)
GFR: 91 mL/min (ref >60.00)
Glucose, Bld: 88 mg/dL (ref 70–99)
Potassium: 3.5 mEq/L (ref 3.5–5.1)
Sodium: 142 mEq/L (ref 135–145)
Total Bilirubin: 0.6 mg/dL (ref 0.2–1.2)
Total Protein: 6.5 g/dL (ref 6.0–8.3)

## 2021-12-06 LAB — LIPID PANEL
Cholesterol: 157 mg/dL (ref 0–200)
HDL: 54.3 mg/dL (ref >39.00)
LDL Cholesterol: 93 mg/dL (ref 0–99)
NonHDL: 103.17
Total CHOL/HDL Ratio: 3
Triglycerides: 49 mg/dL (ref 0.0–149.0)
VLDL: 9.8 mg/dL (ref 0.0–40.0)

## 2021-12-06 LAB — HEMOGLOBIN A1C: Hgb A1c MFr Bld: 5.5 % (ref 4.6–6.5)

## 2021-12-06 MED ORDER — ROSUVASTATIN CALCIUM 20 MG PO TABS: ORAL_TABLET | ORAL | 1 refills | Status: DC

## 2021-12-06 MED ORDER — AMLODIPINE BESY-BENAZEPRIL HCL 10-40 MG PO CAPS: 1.0000 | ORAL_CAPSULE | Freq: Every day | ORAL | 1 refills | Status: DC

## 2021-12-06 NOTE — Progress Notes (Signed)
Office Note 12/06/2021  CC:  Chief Complaint  Patient presents with   Annual Exam    Pt is fasting    HPI:  Patient is a 61 y.o. male who is here for annual health maintenance exam and follow-up depression and hypertension.  When I last saw him 3 months ago I started him on citalopram for depression. He took this medication for a brief period but he is happy to report that he started counseling not long after I last saw him.  He feels like this is helped very well.  He has come out of his depression and has a good outlook and coping mechanisms in place in order to support his daughter. It was recently the 1 year anniversary of her husband's death.  Out of bp med x 1 wk.  He feels well.  Past Medical History:  Diagnosis Date   Atherosclerosis of native artery of extremity (HCC)    BPH with obstruction/lower urinary tract symptoms    [facility] urol is following pt and checking annual PSA's (most recent visit with them was 11/2017)   GERD (gastroesophageal reflux disease)    Hemorrhoids    Hydrocele, bilateral    + epididymal cyst--->urol following ([facility]).   Hypercholesterolemia    Hypertension    Microscopic hematuria 2014   u/s and cystoscopy normal.  No recurrence.  No gross hematuria.   Prediabetes    Prostate cancer screening 12/22/2017   PSA screening is done by his urologist   PVD (peripheral vascular disease) (HCC)    Right ureteral stone 05/2011   Vitamin D deficiency     Past Surgical History:  Procedure Laterality Date   COLONOSCOPY  02/05/2008; 2018   2010 ->for rectal bleeding: normal (presumed cause was hemorrhoids). ( GI).  Repeat 2018 ("somewhere in [place]")- normal except hemorrhoids.  Recall 2028.   KNEE SURGERY     SHOULDER SURGERY      Family History  Problem Relation Age of Onset   Cervical cancer Mother    Diabetes Mother    Hyperlipidemia Mother    Heart disease Father    Diabetes Sister     Social History   Socioeconomic  History   Marital status: Married    Spouse name: Not on file   Number of children: Not on file   Years of education: Not on file   Highest education level: Not on file  Occupational History   Not on file  Tobacco Use   Smoking status: Never   Smokeless tobacco: Never  Vaping Use   Vaping Use: Never used  Substance and Sexual Activity   Alcohol use: Never   Drug use: Never   Sexual activity: Not on file  Other Topics Concern   Not on file  Social History Narrative   Married, 3 children.   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   No T/A/Ds.   Social Determinants of Health   Financial Resource Strain: Not on file  Food Insecurity: Not on file  Transportation Needs: Not on file  Physical Activity: Not on file  Stress: Not on file  Social Connections: Not on file  Intimate Partner Violence: Not on file    Outpatient Medications Prior to Visit  Medication Sig Dispense Refill   aspirin EC 81 MG tablet Take 1 tablet (81 mg total) by mouth daily. 30 tablet 0   amLODipine-benazepril (LOTREL) 10-40 MG capsule Take 1 capsule by mouth daily.  30 capsule 0   citalopram (CELEXA) 20 MG tablet Take 1 tablet (20 mg total) by mouth daily. 30 tablet 0   rosuvastatin (CRESTOR) 20 MG tablet TAKE 1 TABLET(20 MG) BY MOUTH DAILY 30 tablet 0   hydrocortisone (ANUSOL-HC) 2.5 % rectal cream Place 1 application rectally 2 (two) times daily. 1 application twice per day as needed for hemorrhoids (Patient not taking: Reported on 09/07/2021) 30 g 1   No facility-administered medications prior to visit.    Allergies  Allergen Reactions   Hctz [Hydrochlorothiazide] Other (See Comments)    ED    ROS Review of Systems  Constitutional:  Negative for appetite change, chills, fatigue and fever.  HENT:  Negative for congestion, dental problem, ear pain and sore throat.   Eyes:  Negative for discharge, redness and visual disturbance.  Respiratory:  Negative for  cough, chest tightness, shortness of breath and wheezing.   Cardiovascular:  Negative for chest pain, palpitations and leg swelling.  Gastrointestinal:  Negative for abdominal pain, blood in stool, diarrhea, nausea and vomiting.  Genitourinary:  Negative for difficulty urinating, dysuria, flank pain, frequency, hematuria and urgency.  Musculoskeletal:  Negative for arthralgias, back pain, joint swelling, myalgias and neck stiffness.  Skin:  Negative for pallor and rash.  Neurological:  Negative for dizziness, speech difficulty, weakness and headaches.  Hematological:  Negative for adenopathy. Does not bruise/bleed easily.  Psychiatric/Behavioral:  Negative for confusion and sleep disturbance. The patient is not nervous/anxious.     PE;    12/06/2021   10:22 AM 09/07/2021    9:58 AM 02/19/2021    3:13 PM  Vitals with BMI  Height 5\' 10"  5\' 10"  5\' 10"   Weight 247 lbs 239 lbs 10 oz 235 lbs 6 oz  BMI 35.44 34.38 33.78  Systolic 155 133  Diastolic 94 85 82  Pulse 63 70 97    Gen: Alert, well appearing.  Patient is oriented to person, place, time, and situation. AFFECT: pleasant, lucid thought and speech. ENT: Ears: EACs clear, normal epithelium.  TMs with good light reflex and landmarks bilaterally.  Eyes: no injection, icteris, swelling, or exudate.  EOMI, PERRLA. Nose: no drainage or turbinate edema/swelling.  No injection or focal lesion.  Mouth: lips without lesion/swelling.  Oral mucosa pink and moist.  Dentition intact and without obvious caries or gingival swelling.  Oropharynx without erythema, exudate, or swelling.  Neck: supple/nontender.  No LAD, mass, or TM.  Carotid pulses 2+ bilaterally, without bruits. CV: RRR, no m/r/g.   LUNGS: CTA bilat, nonlabored resps, good aeration in all lung fields. ABD: soft, NT, ND, BS normal.  No hepatospenomegaly or mass.  No bruits. EXT: no clubbing, cyanosis, or edema.  Musculoskeletal: no joint swelling, erythema, warmth, or tenderness.   ROM of all joints intact. Skin - no sores or suspicious lesions or rashes or color changes  Pertinent labs:  Lab Results  Component Value Date   TSH 1.30 04/18/2020   Lab Results  Component Value Date   WBC 4.4 04/18/2020   HGB 15.0 04/18/2020   HCT 45.9 04/18/2020   MCV 86.5 04/18/2020   PLT 191.0 04/18/2020   Lab Results  Component Value Date   CREATININE 1.13 11/17/2020   BUN 16 11/17/2020   NA 142 11/17/2020   K 3.6 11/17/2020   CL 104 11/17/2020   CO2 31 11/17/2020   Lab Results  Component Value Date   ALT 12 11/17/2020   AST 15 11/17/2020   ALKPHOS 61 11/17/2020  BILITOT 0.6 11/17/2020   Lab Results  Component Value Date   CHOL 150 04/18/2020   Lab Results  Component Value Date   HDL 52.10 04/18/2020   Lab Results  Component Value Date   LDLCALC 86 04/18/2020   Lab Results  Component Value Date   TRIG 62.0 04/18/2020   Lab Results  Component Value Date   CHOLHDL 3 04/18/2020   Lab Results  Component Value Date   PSA 1.48 11/11/2017   PSA 1.3 10/31/2015   PSA 0.65 01/07/2008   Lab Results  Component Value Date   HGBA1C 5.4 04/18/2020   ASSESSMENT AND PLAN:   #1 health maintenance exam: Reviewed age and gender appropriate health maintenance issues (prudent diet, regular exercise, health risks of tobacco and excessive alcohol, use of seatbelts, fire alarms in home, use of sunscreen).  Also reviewed age and gender appropriate health screening as well as vaccine recommendations. Vaccines: Shingrix->#1 today.  Flu->given today.  Tdap is UTD. Labs: fasting HP + Hba1c Prostate ca screening: he gets this via his urologist with [facility]. Colon ca screening: recall 2028.  #2 hypertension, historically well controlled.  Has been out of medication x1 week.  We will restart his amlodipine-benazepril 10-40 mg/day. Electrolytes and creatinine today.  3.  Hypercholesterolemia.  Has been doing well long-term on rosuvastatin 20 mg a day. Lipid panel and  hepatic panel today.  #4 prediabetes Fasting glucose and hemoglobin A1c checked today.  #5 major depressive disorder. In remission.  Counseling has done wonders for him. We will take citalopram off of his med list.  An After Visit Summary was printed and given to the patient.  FOLLOW UP:  Return in about 6 months (around 06/06/2022) for routine chronic illness f/u.  Signed:  Santiago Bumpers, MD           12/06/2021

## 2021-12-13 ENCOUNTER — Encounter: Payer: Self-pay | Admitting: Family Medicine

## 2022-05-28 ENCOUNTER — Ambulatory Visit: Payer: No Typology Code available for payment source | Admitting: Family Medicine

## 2022-05-28 NOTE — Progress Notes (Deleted)
OFFICE VISIT  05/28/2022  CC: No chief complaint on file.   Patient is a 62 y.o. male who presents for 22-month follow-up hyperlipidemia and hypertension. A/P as of last visit: "A/P as of last visit: "hypertension, historically well controlled.  Has been out of medication x1 week.  We will restart his amlodipine-benazepril 10-40 mg/day. Electrolytes and creatinine today.   3.  Hypercholesterolemia.  Has been doing well long-term on rosuvastatin 20 mg a day. Lipid panel and hepatic panel today.   #4 prediabetes Fasting glucose and hemoglobin A1c checked today.   #5 major depressive disorder. In remission.  Counseling has done wonders for him. We will take citalopram off of his med list."  INTERIM HX: All labs were excellent last visit. ***  Past Medical History:  Diagnosis Date   Atherosclerosis of native artery of extremity (HCC)    BPH with obstruction/lower urinary tract symptoms    [facility] urol is following pt and checking annual PSA's (most recent visit with them was 11/2017)   GERD (gastroesophageal reflux disease)    Hemorrhoids    Hydrocele, bilateral    + epididymal cyst--->urol following ([facility]).   Hypercholesterolemia    Hypertension    Microscopic hematuria 2014   u/s and cystoscopy normal.  No recurrence.  No gross hematuria.   Prediabetes    Prostate cancer screening 12/22/2017   PSA screening is done by his urologist   PVD (peripheral vascular disease) (HCC)    Right ureteral stone 05/2011   Vitamin D deficiency     Past Surgical History:  Procedure Laterality Date   COLONOSCOPY  02/05/2008; 2018   2010 ->for rectal bleeding: normal (presumed cause was hemorrhoids). ([facility]).  Repeat 2018 ("somewhere in [place]")- normal except hemorrhoids.  Recall 2028.   KNEE SURGERY     SHOULDER SURGERY      Outpatient Medications Prior to Visit  Medication Sig Dispense Refill   amLODipine-benazepril (LOTREL) 10-40 MG capsule Take 1 capsule by mouth daily.  90 capsule 1   aspirin EC 81 MG tablet Take 1 tablet (81 mg total) by mouth daily. 30 tablet 0   rosuvastatin (CRESTOR) 20 MG tablet TAKE 1 TABLET(20 MG) BY MOUTH DAILY 90 tablet 1   No facility-administered medications prior to visit.    Allergies  Allergen Reactions   Hctz [Hydrochlorothiazide] Other (See Comments)    ED    Review of Systems As per HPI  PE:    02/25/2022   11:23 AM 12/06/2021   10:22 AM 09/07/2021    9:58 AM  Vitals with BMI  Height  5\' 10"  5\' 10"   Weight  247 lbs 239 lbs 10 oz  BMI  35.44 34.38  Systolic 155 155 161  Diastolic 94 94 85  Pulse  63 70     Physical Exam  ***  LABS:  Last CBC Lab Results  Component Value Date   WBC 3.4 (L) 12/06/2021   HGB 14.4 12/06/2021   HCT 44.1 12/06/2021   MCV 87.4 12/06/2021   MCH 28.5 06/25/2011   RDW 13.8 12/06/2021   PLT 207.0 12/06/2021   Last metabolic panel Lab Results  Component Value Date   GLUCOSE 88 12/06/2021   NA 142 12/06/2021   K 3.5 12/06/2021   CL 105 12/06/2021   CO2 32 12/06/2021   BUN 12 12/06/2021   CREATININE 0.90 12/06/2021   CALCIUM 8.8 12/06/2021   PROT 6.5 12/06/2021   ALBUMIN 3.9 12/06/2021   BILITOT 0.6 12/06/2021   ALKPHOS  53 12/06/2021   AST 13 12/06/2021   ALT 13 12/06/2021   Last lipids Lab Results  Component Value Date   CHOL 157 12/06/2021   HDL 54.30 12/06/2021   LDLCALC 93 12/06/2021   LDLDIRECT 168.0 01/07/2008   TRIG 49.0 12/06/2021   CHOLHDL 3 12/06/2021   Last hemoglobin A1c Lab Results  Component Value Date   HGBA1C 5.5 12/06/2021   IMPRESSION AND PLAN:  No problem-specific Assessment & Plan notes found for this encounter.   An After Visit Summary was printed and given to the patient.  FOLLOW UP: No follow-ups on file. Next CPE 12/07/2022  Signed:  Santiago Bumpers, MD           05/28/2022

## 2022-06-13 ENCOUNTER — Encounter: Payer: Self-pay | Admitting: Family Medicine

## 2022-08-26 LAB — PSA: PSA: 3.66

## 2022-11-03 ENCOUNTER — Other Ambulatory Visit: Payer: Self-pay | Admitting: Family Medicine

## 2022-12-09 NOTE — Patient Instructions (Signed)
   It was very nice to see you today!     Please try these tips to maintain a healthy lifestyle:  Eat most of your calories during the day when you are active. Eliminate processed foods including packaged sweets (pies, cakes, cookies), reduce intake of potatoes, white bread, white pasta, and white rice. Look for whole grain options, oat flour or almond flour.  Each meal should contain half fruits/vegetables, one quarter protein, and one quarter carbs (no bigger than a computer mouse).  Cut down on sweet beverages. This includes juice, soda, and sweet tea. Also watch fruit intake, though this is a healthier sweet option, it still contains natural sugar! Limit to 3 servings daily.  Drink at least 1 glass of water with each meal and aim for at least 8 glasses per day  Exercise at least 150 minutes every week.

## 2022-12-11 ENCOUNTER — Ambulatory Visit (INDEPENDENT_AMBULATORY_CARE_PROVIDER_SITE_OTHER): Payer: No Typology Code available for payment source | Admitting: Family Medicine

## 2022-12-11 DIAGNOSIS — I10 Essential (primary) hypertension: Secondary | ICD-10-CM

## 2022-12-20 ENCOUNTER — Other Ambulatory Visit: Payer: Self-pay | Admitting: Family Medicine

## 2023-01-07 ENCOUNTER — Other Ambulatory Visit: Payer: Self-pay | Admitting: Family Medicine

## 2023-01-21 ENCOUNTER — Ambulatory Visit (INDEPENDENT_AMBULATORY_CARE_PROVIDER_SITE_OTHER): Payer: No Typology Code available for payment source | Admitting: Family Medicine

## 2023-01-21 ENCOUNTER — Encounter: Payer: Self-pay | Admitting: Family Medicine

## 2023-01-21 VITALS — BP 140/90 | HR 75 | Wt 241.2 lb

## 2023-01-21 DIAGNOSIS — M79604 Pain in right leg: Secondary | ICD-10-CM

## 2023-01-21 LAB — CBC WITH DIFFERENTIAL/PLATELET
Basophils Absolute: 0 10*3/uL (ref 0.0–0.1)
Basophils Relative: 0.6 % (ref 0.0–3.0)
Eosinophils Absolute: 0.1 10*3/uL (ref 0.0–0.7)
Eosinophils Relative: 3.2 % (ref 0.0–5.0)
HCT: 44.9 % (ref 39.0–52.0)
Hemoglobin: 14.6 g/dL (ref 13.0–17.0)
Lymphocytes Relative: 35.5 % (ref 12.0–46.0)
Lymphs Abs: 1.4 10*3/uL (ref 0.7–4.0)
MCHC: 32.5 g/dL (ref 30.0–36.0)
MCV: 88.7 fL (ref 78.0–100.0)
Monocytes Absolute: 0.3 10*3/uL (ref 0.1–1.0)
Monocytes Relative: 8.4 % (ref 3.0–12.0)
Neutro Abs: 2.1 10*3/uL (ref 1.4–7.7)
Neutrophils Relative %: 52.3 % (ref 43.0–77.0)
Platelets: 203 10*3/uL (ref 150.0–400.0)
RBC: 5.06 Mil/uL (ref 4.22–5.81)
RDW: 13.8 % (ref 11.5–15.5)
WBC: 4 10*3/uL (ref 4.0–10.5)

## 2023-01-21 LAB — BASIC METABOLIC PANEL
BUN: 15 mg/dL (ref 6–23)
CO2: 31 meq/L (ref 19–32)
Calcium: 8.9 mg/dL (ref 8.4–10.5)
Chloride: 106 meq/L (ref 96–112)
Creatinine, Ser: 1 mg/dL (ref 0.40–1.50)
GFR: 79.56 mL/min (ref >60.00)
Glucose, Bld: 92 mg/dL (ref 70–99)
Potassium: 3.9 meq/L (ref 3.5–5.1)
Sodium: 143 meq/L (ref 135–145)

## 2023-01-21 NOTE — Progress Notes (Signed)
OFFICE VISIT  01/21/2023  CC:  Chief Complaint  Patient presents with   Leg Pain    About a week; pain seems to increase in the mornings. An aching pain, sometimes can feel like a cramp. Pt has taken tylenol but did not find much relief.     Patient is a 62 y.o. male who presents for right leg pain.  HPI: Onset of right calf pain about 5 days ago. No preceding overuse or injury. No recent history of prolonged immobilization or surgery. No personal or family history of DVT or PE.  Review of systems: No rash, no myalgias, no leg edema, no arthralgias.  Past Medical History:  Diagnosis Date   Atherosclerosis of native artery of extremity (HCC)    BPH with obstruction/lower urinary tract symptoms    [facility] urol is following pt and checking annual PSA's (most recent visit with them was 11/2017)   GERD (gastroesophageal reflux disease)    Hemorrhoids    Hydrocele, bilateral    + epididymal cyst--->urol following ([facility]).   Hypercholesterolemia    Hypertension    Microscopic hematuria 2014   u/s and cystoscopy normal.  No recurrence.  No gross hematuria.   Prediabetes    Prostate cancer screening 12/22/2017   PSA screening is done by his urologist   PVD (peripheral vascular disease) (HCC)    Right ureteral stone 05/2011   Vitamin D deficiency     Past Surgical History:  Procedure Laterality Date   COLONOSCOPY  02/05/2008; 2018   2010 ->for rectal bleeding: normal (presumed cause was hemorrhoids). ( GI).  Repeat 2018 ("somewhere in [place]")- normal except hemorrhoids.  Recall 2028.   KNEE SURGERY     SHOULDER SURGERY      Outpatient Medications Prior to Visit  Medication Sig Dispense Refill   amLODipine-benazepril (LOTREL) 10-40 MG capsule Take 1 capsule by mouth daily. 90 capsule 1   aspirin EC 81 MG tablet Take 1 tablet (81 mg total) by mouth daily. 30 tablet 0   rosuvastatin (CRESTOR) 20 MG tablet TAKE 1 TABLET(20 MG) BY MOUTH DAILY 30 tablet 0   No  facility-administered medications prior to visit.    Allergies  Allergen Reactions   Hctz [Hydrochlorothiazide] Other (See Comments)    ED    Review of Systems  As per HPI  PE:    01/21/2023    8:52 AM 01/21/2023    8:46 AM 02/25/2022   11:23 AM  Vitals with BMI  Weight  241 lbs 3 oz   Systolic 140 148 811  Diastolic 90 92 94  Pulse  75      Physical Exam  General: Alert and well-appearing Legs without edema, erythema, or asymmetry. Mild tenderness to palpation over the proximal calf on the right leg.  LABS:   Lab Results  Component Value Date   HGBA1C 5.5 12/06/2021   Last CBC Lab Results  Component Value Date   WBC 3.4 (L) 12/06/2021   HGB 14.4 12/06/2021   HCT 44.1 12/06/2021   MCV 87.4 12/06/2021   MCH 28.5 06/25/2011   RDW 13.8 12/06/2021   PLT 207.0 12/06/2021   Last metabolic panel Lab Results  Component Value Date   GLUCOSE 88 12/06/2021   NA 142 12/06/2021   K 3.5 12/06/2021   CL 105 12/06/2021   CO2 32 12/06/2021   BUN 12 12/06/2021   CREATININE 0.90 12/06/2021   GFR 91.00 12/06/2021   CALCIUM 8.8 12/06/2021   PROT 6.5 12/06/2021  ALBUMIN 3.9 12/06/2021   BILITOT 0.6 12/06/2021   ALKPHOS 53 12/06/2021   AST 13 12/06/2021   ALT 13 12/06/2021   IMPRESSION AND PLAN:  Acute right calf pain. Order venous Doppler ultrasound rule out DVT. Check D-dimer, BMet, and CBC today. An After Visit Summary was printed and given to the patient.  FOLLOW UP: Return for TBD based on labs/imaging results.  Signed:  Santiago Bumpers, MD           01/21/2023

## 2023-01-22 LAB — D-DIMER, QUANTITATIVE: D-Dimer, Quant: 0.28 ug{FEU}/mL (ref <0.50)

## 2023-02-04 ENCOUNTER — Ambulatory Visit (HOSPITAL_BASED_OUTPATIENT_CLINIC_OR_DEPARTMENT_OTHER)
Admission: RE | Admit: 2023-02-04 | Discharge: 2023-02-04 | Disposition: A | Discharge status: Home / Self Care | Payer: No Typology Code available for payment source | Source: Ambulatory Visit | Attending: Family Medicine | Admitting: Family Medicine

## 2023-02-04 DIAGNOSIS — M79604 Pain in right leg: Secondary | ICD-10-CM | POA: Diagnosis present

## 2023-02-14 ENCOUNTER — Other Ambulatory Visit: Payer: Self-pay | Admitting: Family Medicine

## 2023-02-17 ENCOUNTER — Other Ambulatory Visit: Payer: Self-pay

## 2023-03-19 ENCOUNTER — Telehealth: Payer: Self-pay

## 2023-03-19 ENCOUNTER — Other Ambulatory Visit: Payer: Self-pay | Admitting: Family Medicine

## 2023-03-19 NOTE — Telephone Encounter (Signed)
 Please advise

## 2023-03-19 NOTE — Telephone Encounter (Signed)
Copied from CRM [id number]. Topic: Clinical - Medication Refill >> Mar 19, 2023  4:14 PM Almira Coaster wrote: Most Recent Primary Care Visit:  Provider: Jeoffrey Massed  Department: [facility]  Visit Type: PROCEDURE  Date: 01/21/2023  Medication: rosuvastatin (CRESTOR) 20 MG tablet  Has the patient contacted their pharmacy? Yes, refill request sent but no response (Agent: If no, request that the patient contact the pharmacy for the refill. If patient does not wish to contact the pharmacy document the reason why and proceed with request.) (Agent: If yes, when and what did the pharmacy advise?)  Is this the correct pharmacy for this prescription? Yes If no, delete pharmacy and type the correct one.  This is the patient's preferred pharmacy:  [id number] [place], [place] - [address] AT [facility] [name] [place] [address] Phone: [phone] Fax: [phone]   Has the prescription been filled recently? No  Is the patient out of the medication? Yes  Has the patient been seen for an appointment in the last year OR does the patient have an upcoming appointment? Yes  Can we respond through MyChart? Yes  Agent: Please be advised that Rx refills may take up to 3 business days. We ask that you follow-up with your pharmacy.

## 2023-03-19 NOTE — Telephone Encounter (Signed)
Copied from CRM [id number]. Topic: Clinical - Lab/Test Results >> Mar 19, 2023  4:15 PM Almira Coaster wrote: Reason for CRM: Patient had an ultrasound done on 02/04/2023 and has not gotten any update on the results. He would like a call back to review results.

## 2023-03-19 NOTE — Telephone Encounter (Signed)
Last Fill: 01/08/23  Last OV: 12/06/21 Next OV: None Scheduled  Routing to provider for review/authorization.

## 2023-03-20 NOTE — Telephone Encounter (Signed)
Spoke with pt regarding regarding results.

## 2023-03-20 NOTE — Telephone Encounter (Signed)
The ultrasound of his leg was normal.  No blood clot.

## 2023-03-21 ENCOUNTER — Other Ambulatory Visit: Payer: Self-pay | Admitting: Family Medicine

## 2023-04-04 ENCOUNTER — Other Ambulatory Visit: Payer: Self-pay | Admitting: Family Medicine

## 2023-04-04 NOTE — Telephone Encounter (Signed)
 Copied from CRM [id number]. Topic: Clinical - Medication Refill >> Apr 04, 2023 10:10 AM Christopher Cobb wrote: Most Recent Primary Care Visit:  Provider: Jeoffrey Massed  Department: [facility]  Visit Type: PROCEDURE  Date: 01/21/2023  Medication: rosuvastatin (CRESTOR) 20 MG tablet  Has the patient contacted their pharmacy? Yes (Agent: If no, request that the patient contact the pharmacy for the refill. If patient does not wish to contact the pharmacy document the reason why and proceed with request.) (Agent: If yes, when and what did the pharmacy advise?)  Is this the correct pharmacy for this prescription? Yes If no, delete pharmacy and type the correct one.  This is the patient's preferred pharmacy:  [id number] [place], [place] - [address] AT [facility] & [name] [place] [address] Phone: [phone] Fax: [phone]   Has the prescription been filled recently? Yes  Is the patient out of the medication? Yes  Has the patient been seen for an appointment in the last year OR does the patient have an upcoming appointment? Yes  Can we respond through MyChart? No  Agent: Please be advised that Rx refills may take up to 3 business days. We ask that you follow-up with your pharmacy.

## 2023-04-16 ENCOUNTER — Other Ambulatory Visit: Payer: Self-pay

## 2023-04-16 MED ORDER — ROSUVASTATIN CALCIUM 20 MG PO TABS: ORAL_TABLET | ORAL | 0 refills | Status: DC

## 2023-06-13 ENCOUNTER — Other Ambulatory Visit: Payer: Self-pay | Admitting: Family Medicine

## 2023-06-13 NOTE — Telephone Encounter (Unsigned)
 Copied from CRM [id number]. Topic: Clinical - Medication Refill >> Jun 13, 2023  4:16 PM Magdalene School wrote: Medication: rosuvastatin  (CRESTOR ) 20 MG tablet  Has the patient contacted their pharmacy? Yes (Agent: If no, request that the patient contact the pharmacy for the refill. If patient does not wish to contact the pharmacy document the reason why and proceed with request.) (Agent: If yes, when and what did the pharmacy advise?) no refills available.  This is the patient's preferred pharmacy:  [facility] #[id number] [place], [place] - [address] AT [facility] OF [facility] & [address] [place] [address] Phone: [phone] Fax: [phone]  Is this the correct pharmacy for this prescription? Yes If no, delete pharmacy and type the correct one.   Has the prescription been filled recently? No  Is the patient out of the medication? Yes  Has the patient been seen for an appointment in the last year OR does the patient have an upcoming appointment? Yes  Can we respond through MyChart? No  Agent: Please be advised that Rx refills may take up to 3 business days. We ask that you follow-up with your pharmacy.

## 2023-06-17 ENCOUNTER — Other Ambulatory Visit: Payer: Self-pay | Admitting: Family Medicine

## 2023-07-09 NOTE — Progress Notes (Signed)
This appointment was canceled.

## 2023-07-29 ENCOUNTER — Encounter: Payer: Self-pay | Admitting: Family Medicine

## 2023-07-29 ENCOUNTER — Ambulatory Visit: Admitting: Family Medicine

## 2023-07-29 ENCOUNTER — Ambulatory Visit: Payer: Self-pay | Admitting: Family Medicine

## 2023-07-29 VITALS — BP 105/67 | HR 65 | Temp 98.3°F | Ht 70.0 in | Wt 235.8 lb

## 2023-07-29 DIAGNOSIS — I10 Essential (primary) hypertension: Secondary | ICD-10-CM | POA: Diagnosis not present

## 2023-07-29 DIAGNOSIS — Z0000 Encounter for general adult medical examination without abnormal findings: Secondary | ICD-10-CM | POA: Diagnosis not present

## 2023-07-29 DIAGNOSIS — Z23 Encounter for immunization: Secondary | ICD-10-CM | POA: Diagnosis not present

## 2023-07-29 DIAGNOSIS — Z125 Encounter for screening for malignant neoplasm of prostate: Secondary | ICD-10-CM

## 2023-07-29 DIAGNOSIS — R972 Elevated prostate specific antigen [PSA]: Secondary | ICD-10-CM

## 2023-07-29 DIAGNOSIS — R7303 Prediabetes: Secondary | ICD-10-CM | POA: Diagnosis not present

## 2023-07-29 DIAGNOSIS — E7800 Pure hypercholesterolemia, unspecified: Secondary | ICD-10-CM

## 2023-07-29 LAB — COMPREHENSIVE METABOLIC PANEL WITH GFR
ALT: 10 U/L (ref 0–53)
AST: 11 U/L (ref 0–37)
Albumin: 4 g/dL (ref 3.5–5.2)
Alkaline Phosphatase: 57 U/L (ref 39–117)
BUN: 18 mg/dL (ref 6–23)
CO2: 32 meq/L (ref 19–32)
Calcium: 9.6 mg/dL (ref 8.4–10.5)
Chloride: 105 meq/L (ref 96–112)
Creatinine, Ser: 1.13 mg/dL (ref 0.40–1.50)
GFR: 68.46 mL/min (ref >60.00)
Glucose, Bld: 91 mg/dL (ref 70–99)
Potassium: 3.9 meq/L (ref 3.5–5.1)
Sodium: 142 meq/L (ref 135–145)
Total Bilirubin: 0.7 mg/dL (ref 0.2–1.2)
Total Protein: 7.1 g/dL (ref 6.0–8.3)

## 2023-07-29 LAB — CBC WITH DIFFERENTIAL/PLATELET
Basophils Absolute: 0 10*3/uL (ref 0.0–0.1)
Basophils Relative: 0.5 % (ref 0.0–3.0)
Eosinophils Absolute: 0.2 10*3/uL (ref 0.0–0.7)
Eosinophils Relative: 5 % (ref 0.0–5.0)
HCT: 46.2 % (ref 39.0–52.0)
Hemoglobin: 14.9 g/dL (ref 13.0–17.0)
Lymphocytes Relative: 37 % (ref 12.0–46.0)
Lymphs Abs: 1.7 10*3/uL (ref 0.7–4.0)
MCHC: 32.2 g/dL (ref 30.0–36.0)
MCV: 86.7 fl (ref 78.0–100.0)
Monocytes Absolute: 0.4 10*3/uL (ref 0.1–1.0)
Monocytes Relative: 7.6 % (ref 3.0–12.0)
Neutro Abs: 2.3 10*3/uL (ref 1.4–7.7)
Neutrophils Relative %: 49.9 % (ref 43.0–77.0)
Platelets: 229 10*3/uL (ref 150.0–400.0)
RBC: 5.33 Mil/uL (ref 4.22–5.81)
RDW: 14.1 % (ref 11.5–15.5)
WBC: 4.6 10*3/uL (ref 4.0–10.5)

## 2023-07-29 LAB — LIPID PANEL
Cholesterol: 237 mg/dL — ABNORMAL HIGH (ref 0–200)
HDL: 53.7 mg/dL (ref >39.00)
LDL Cholesterol: 174 mg/dL — ABNORMAL HIGH (ref 0–99)
NonHDL: 183.29
Total CHOL/HDL Ratio: 4
Triglycerides: 47 mg/dL (ref 0.0–149.0)
VLDL: 9.4 mg/dL (ref 0.0–40.0)

## 2023-07-29 LAB — HEMOGLOBIN A1C: Hgb A1c MFr Bld: 5.6 % (ref 4.6–6.5)

## 2023-07-29 LAB — PSA: PSA: 3.44 ng/mL (ref 0.10–4.00)

## 2023-07-29 MED ORDER — ROSUVASTATIN CALCIUM 20 MG PO TABS: ORAL_TABLET | ORAL | 3 refills | Status: DC

## 2023-07-29 MED ORDER — AMLODIPINE BESY-BENAZEPRIL HCL 10-40 MG PO CAPS: 1.0000 | ORAL_CAPSULE | Freq: Every day | ORAL | 3 refills | Status: AC

## 2023-07-29 NOTE — Patient Instructions (Signed)
 Health Maintenance, Male  Adopting a healthy lifestyle and getting preventive care are important in promoting health and wellness. Ask your health care provider about:  The right schedule for you to have regular tests and exams.  Things you can do on your own to prevent diseases and keep yourself healthy.  What should I know about diet, weight, and exercise?  Eat a healthy diet    Eat a diet that includes plenty of vegetables, fruits, low-fat dairy products, and lean protein.  Do not eat a lot of foods that are high in solid fats, added sugars, or sodium.  Maintain a healthy weight  Body mass index (BMI) is a measurement that can be used to identify possible weight problems. It estimates body fat based on height and weight. Your health care provider can help determine your BMI and help you achieve or maintain a healthy weight.  Get regular exercise  Get regular exercise. This is one of the most important things you can do for your health. Most adults should:  Exercise for at least 150 minutes each week. The exercise should increase your heart rate and make you sweat (moderate-intensity exercise).  Do strengthening exercises at least twice a week. This is in addition to the moderate-intensity exercise.  Spend less time sitting. Even light physical activity can be beneficial.  Watch cholesterol and blood lipids  Have your blood tested for lipids and cholesterol at 65 years of age, then have this test every 5 years.  You may need to have your cholesterol levels checked more often if:  Your lipid or cholesterol levels are high.  You are older than 65 years of age.  You are at high risk for heart disease.  What should I know about cancer screening?  Many types of cancers can be detected early and may often be prevented. Depending on your health history and family history, you may need to have cancer screening at various ages. This may include screening for:  Colorectal cancer.  Prostate cancer.  Skin cancer.  Lung  cancer.  What should I know about heart disease, diabetes, and high blood pressure?  Blood pressure and heart disease  High blood pressure causes heart disease and increases the risk of stroke. This is more likely to develop in people who have high blood pressure readings or are overweight.  Talk with your health care provider about your target blood pressure readings.  Have your blood pressure checked:  Every 3-5 years if you are 9-95 years of age.  Every year if you are 85 years old or older.  If you are between the ages of 29 and 29 and are a current or former smoker, ask your health care provider if you should have a one-time screening for abdominal aortic aneurysm (AAA).  Diabetes  Have regular diabetes screenings. This checks your fasting blood sugar level. Have the screening done:  Once every three years after age 23 if you are at a normal weight and have a low risk for diabetes.  More often and at a younger age if you are overweight or have a high risk for diabetes.  What should I know about preventing infection?  Hepatitis B  If you have a higher risk for hepatitis B, you should be screened for this virus. Talk with your health care provider to find out if you are at risk for hepatitis B infection.  Hepatitis C  Blood testing is recommended for:  Everyone born from [birth year] through 1967.  Anyone  with known risk factors for hepatitis C.  Sexually transmitted infections (STIs)  You should be screened each year for STIs, including gonorrhea and chlamydia, if:  You are sexually active and are younger than 65 years of age.  You are older than 65 years of age and your health care provider tells you that you are at risk for this type of infection.  Your sexual activity has changed since you were last screened, and you are at increased risk for chlamydia or gonorrhea. Ask your health care provider if you are at risk.  Ask your health care provider about whether you are at high risk for HIV. Your health care provider  may recommend a prescription medicine to help prevent HIV infection. If you choose to take medicine to prevent HIV, you should first get tested for HIV. You should then be tested every 3 months for as long as you are taking the medicine.  Follow these instructions at home:  Alcohol use  Do not drink alcohol if your health care provider tells you not to drink.  If you drink alcohol:  Limit how much you have to 0-2 drinks a day.  Know how much alcohol is in your drink. In the [place] one drink equals one 12 oz bottle of beer (355 mL), one 5 oz glass of wine (148 mL), or one 1 oz glass of hard liquor (44 mL).  Lifestyle  Do not use any products that contain nicotine or tobacco. These products include cigarettes, chewing tobacco, and vaping devices, such as e-cigarettes. If you need help quitting, ask your health care provider.  Do not use street drugs.  Do not share needles.  Ask your health care provider for help if you need support or information about quitting drugs.  General instructions  Schedule regular health, dental, and eye exams.  Stay current with your vaccines.  Tell your health care provider if:  You often feel depressed.  You have ever been abused or do not feel safe at home.  Summary  Adopting a healthy lifestyle and getting preventive care are important in promoting health and wellness.  Follow your health care provider's instructions about healthy diet, exercising, and getting tested or screened for diseases.  Follow your health care provider's instructions on monitoring your cholesterol and blood pressure.  This information is not intended to replace advice given to you by your health care provider. Make sure you discuss any questions you have with your health care provider.  Document Revised: 06/05/2020 Document Reviewed: 06/05/2020  Elsevier Patient Education  2024 [employer].

## 2023-07-29 NOTE — Progress Notes (Signed)
 Office Note 07/29/2023  CC:  Chief Complaint  Patient presents with   Medical Management of Chronic Issues    HPI:  Patient is a 63 y.o. male who is here for annual health maintenance exam and follow-up hypertension, hyperlipidemia, and prediabetes.  Christopher Cobb feels well. He ran out of his rosuvastatin  several months ago. Occasional home blood pressure measurement is around 130/80 average.  Past Medical History:  Diagnosis Date   Atherosclerosis of native artery of extremity (HCC)    BPH with obstruction/lower urinary tract symptoms    [facility] urol is following pt and checking annual PSA's (most recent visit with them was 11/2017)   GERD (gastroesophageal reflux disease)    Hemorrhoids    Hydrocele, bilateral    + epididymal cyst--->urol following ([facility]).   Hypercholesterolemia    Hypertension    Microscopic hematuria 2014   u/s and cystoscopy normal.  No recurrence.  No gross hematuria.   Prediabetes    Prostate cancer screening 12/22/2017   PSA screening is done by his urologist   PVD (peripheral vascular disease) (HCC)    Right ureteral stone 05/2011   Vitamin D  deficiency     Past Surgical History:  Procedure Laterality Date   COLONOSCOPY  02/05/2008; 2018   2010 ->for rectal bleeding: normal (presumed cause was hemorrhoids). ( GI).  Repeat 2018 (somewhere in [place])- normal except hemorrhoids.  Recall 2028.   KNEE SURGERY     SHOULDER SURGERY      Family History  Problem Relation Age of Onset   Cervical cancer Mother    Diabetes Mother    Hyperlipidemia Mother    Heart disease Father    Diabetes Sister     Social History   Socioeconomic History   Marital status: Married    Spouse name: Not on file   Number of children: Not on file   Years of education: Not on file   Highest education level: Not on file  Occupational History   Not on file  Tobacco Use   Smoking status: Never   Smokeless tobacco: Never  Vaping Use   Vaping status:  Never Used  Substance and Sexual Activity   Alcohol use: Never   Drug use: Never   Sexual activity: Not on file  Other Topics Concern   Not on file  Social History Narrative   Married, 3 children.   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   No T/A/Ds.   Social Drivers of Corporate investment banker Strain: Not on file  Food Insecurity: Not on file  Transportation Needs: Not on file  Physical Activity: Not on file  Stress: Not on file  Social Connections: Not on file  Intimate Partner Violence: Not on file    Outpatient Medications Prior to Visit  Medication Sig Dispense Refill   aspirin  EC 81 MG tablet Take 1 tablet (81 mg total) by mouth daily. 30 tablet 0   amLODipine -benazepril  (LOTREL) 10-40 MG capsule Take 1 capsule by mouth daily. 90 capsule 1   rosuvastatin  (CRESTOR ) 20 MG tablet TAKE 1 TABLET(20 MG) BY MOUTH DAILY 30 tablet 0   No facility-administered medications prior to visit.    Allergies  Allergen Reactions   Hctz [Hydrochlorothiazide ] Other (See Comments)    ED    Review of Systems  Constitutional:  Negative for appetite change, chills, fatigue and fever.  HENT:  Negative for congestion, dental problem, ear pain and sore throat.  Eyes:  Negative for discharge, redness and visual disturbance.  Respiratory:  Negative for cough, chest tightness, shortness of breath and wheezing.   Cardiovascular:  Negative for chest pain, palpitations and leg swelling.  Gastrointestinal:  Negative for abdominal pain, blood in stool, diarrhea, nausea and vomiting.  Genitourinary:  Negative for difficulty urinating, dysuria, flank pain, frequency, hematuria and urgency.  Musculoskeletal:  Negative for arthralgias, back pain, joint swelling, myalgias and neck stiffness.  Skin:  Negative for pallor and rash.  Neurological:  Negative for dizziness, speech difficulty, weakness and headaches.  Hematological:  Negative for adenopathy. Does not  bruise/bleed easily.  Psychiatric/Behavioral:  Negative for confusion and sleep disturbance. The patient is not nervous/anxious.    PE;    07/29/2023   10:12 AM 01/21/2023    8:52 AM 01/21/2023    8:46 AM  Vitals with BMI  Height 5' 10    Weight 235 lbs 13 oz  241 lbs 3 oz  BMI 33.83    Systolic 105 140 851  Diastolic 67 90 92  Pulse 65  75    Gen: Alert, well appearing.  Patient is oriented to person, place, time, and situation. AFFECT: pleasant, lucid thought and speech. ENT: Ears: EACs clear, normal epithelium.  TMs with good light reflex and landmarks bilaterally.  Eyes: no injection, icteris, swelling, or exudate.  EOMI, PERRLA. Nose: no drainage or turbinate edema/swelling.  No injection or focal lesion.  Mouth: lips without lesion/swelling.  Oral mucosa pink and moist.  Dentition intact and without obvious caries or gingival swelling.  Oropharynx without erythema, exudate, or swelling.  Neck: supple/nontender.  No LAD, mass, or TM.  Carotid pulses 2+ bilaterally, without bruits. CV: RRR, no m/r/g.   LUNGS: CTA bilat, nonlabored resps, good aeration in all lung fields. ABD: soft, NT, ND, BS normal.  No hepatospenomegaly or mass.  No bruits. EXT: no clubbing, cyanosis, or edema.  Musculoskeletal: no joint swelling, erythema, warmth, or tenderness.  ROM of all joints intact. Skin - no sores or suspicious lesions or rashes or color changes  Pertinent labs:  Lab Results  Component Value Date   TSH 1.30 04/18/2020   Lab Results  Component Value Date   WBC 4.0 01/21/2023   HGB 14.6 01/21/2023   HCT 44.9 01/21/2023   MCV 88.7 01/21/2023   PLT 203.0 01/21/2023   Lab Results  Component Value Date   CREATININE 1.00 01/21/2023   BUN 15 01/21/2023   NA 143 01/21/2023   K 3.9 01/21/2023   CL 106 01/21/2023   CO2 31 01/21/2023   Lab Results  Component Value Date   ALT 13 12/06/2021   AST 13 12/06/2021   ALKPHOS 53 12/06/2021   BILITOT 0.6 12/06/2021   Lab Results   Component Value Date   CHOL 157 12/06/2021   Lab Results  Component Value Date   HDL 54.30 12/06/2021   Lab Results  Component Value Date   LDLCALC 93 12/06/2021   Lab Results  Component Value Date   TRIG 49.0 12/06/2021   Lab Results  Component Value Date   CHOLHDL 3 12/06/2021   Lab Results  Component Value Date   PSA 3.66 08/26/2022   PSA 1.48 11/11/2017   PSA 1.3 10/31/2015   Lab Results  Component Value Date   HGBA1C 5.5 12/06/2021   ASSESSMENT AND PLAN:   #1 health maintenance exam: Reviewed age and gender appropriate health maintenance issues (prudent diet, regular exercise, health risks of tobacco and excessive alcohol,  use of seatbelts, fire alarms in home, use of sunscreen).  Also reviewed age and gender appropriate health screening as well as vaccine recommendations. Vaccines: Shingrix ->#2 today.  He deferred Prevnar until next follow-up.  Otherwise all UTD. Labs: fasting HP + Hba1c (prediabetes). Prostate ca screening: he gets this via his urologist with [facility] but he requests a PSA level here today.  He has a history of mild PSA elevation. Colon ca screening: recall 2028.  #2 hypertension, well-controlled on amlodipine -benazepril  10-40, 1/day.  #3 hypercholesterolemia.  Ran out of his statin several months ago. Will restart rosuvastatin  20 mg a day. Lipid panel and hepatic panel today.  An After Visit Summary was printed and given to the patient.  FOLLOW UP:  Return in about 6 months (around 01/29/2024) for routine chronic illness f/u.  Signed:  Gerlene Hockey, MD           07/29/2023

## 2023-09-30 ENCOUNTER — Other Ambulatory Visit (INDEPENDENT_AMBULATORY_CARE_PROVIDER_SITE_OTHER)

## 2023-09-30 DIAGNOSIS — E7800 Pure hypercholesterolemia, unspecified: Secondary | ICD-10-CM | POA: Diagnosis not present

## 2023-09-30 LAB — LIPID PANEL
Cholesterol: 154 mg/dL (ref 0–200)
HDL: 58.9 mg/dL (ref >39.00)
LDL Cholesterol: 86 mg/dL (ref 0–99)
NonHDL: 95.41
Total CHOL/HDL Ratio: 3
Triglycerides: 46 mg/dL (ref 0.0–149.0)
VLDL: 9.2 mg/dL (ref 0.0–40.0)

## 2023-09-30 LAB — ALT: ALT: 12 U/L (ref 0–53)

## 2023-09-30 LAB — AST: AST: 12 U/L (ref 0–37)

## 2023-10-01 ENCOUNTER — Ambulatory Visit: Payer: Self-pay | Admitting: Family Medicine

## 2023-10-01 DIAGNOSIS — E7800 Pure hypercholesterolemia, unspecified: Secondary | ICD-10-CM

## 2023-10-01 MED ORDER — ROSUVASTATIN CALCIUM 40 MG PO TABS: 40.0000 mg | ORAL_TABLET | Freq: Every day | ORAL | 2 refills | Status: AC

## 2023-10-02 ENCOUNTER — Other Ambulatory Visit: Payer: Self-pay

## 2023-10-30 ENCOUNTER — Encounter: Payer: Self-pay | Admitting: Family Medicine

## 2023-10-30 ENCOUNTER — Ambulatory Visit: Admitting: Family Medicine

## 2023-10-30 VITALS — BP 110/73 | HR 66 | Temp 98.0°F | Ht 70.0 in | Wt 238.4 lb

## 2023-10-30 DIAGNOSIS — N529 Male erectile dysfunction, unspecified: Secondary | ICD-10-CM | POA: Diagnosis not present

## 2023-10-30 DIAGNOSIS — Z23 Encounter for immunization: Secondary | ICD-10-CM | POA: Diagnosis not present

## 2023-10-30 DIAGNOSIS — E7800 Pure hypercholesterolemia, unspecified: Secondary | ICD-10-CM

## 2023-10-30 DIAGNOSIS — I10 Essential (primary) hypertension: Secondary | ICD-10-CM

## 2023-10-30 MED ORDER — TADALAFIL 10 MG PO TABS
ORAL_TABLET | ORAL | 2 refills | Status: AC
Start: 2023-10-30 — End: ?

## 2023-10-30 NOTE — Progress Notes (Signed)
 OFFICE VISIT  10/30/2023  CC:  Chief Complaint  Patient presents with   Medical Management of Chronic Issues    Patient is a 63 y.o. male who presents for 86-month follow-up hypertension and hyperlipidemia.  INTERIM HX: Daltin feels well.  He has continued on his 20 mg rosuvastatin  daily because he wanted to finish the prescription before going up to the 40 mg dose.  History of erectile dysfunction, responded well to Cialis in the past, asks for refill today.  Review of systems: No headaches, no lower extremity swelling, no muscle or joint aches, no rash, no palpitations, no shortness of breath, no chest pain.  Past Medical History:  Diagnosis Date   Atherosclerosis of native artery of extremity    BPH with obstruction/lower urinary tract symptoms    [facility] urol is following pt and checking annual PSA's (most recent visit with them was 11/2017)   GERD (gastroesophageal reflux disease)    Hemorrhoids    Hydrocele, bilateral    + epididymal cyst--->urol following ([facility]).   Hypercholesterolemia    Hypertension    Microscopic hematuria 2014   u/s and cystoscopy normal.  No recurrence.  No gross hematuria.   Prediabetes    Prostate cancer screening 12/22/2017   PSA screening is done by his urologist   PVD (peripheral vascular disease)    Right ureteral stone 05/2011   Vitamin D  deficiency     Past Surgical History:  Procedure Laterality Date   COLONOSCOPY  02/05/2008; 2018   2010 ->for rectal bleeding: normal (presumed cause was hemorrhoids). ([facility]).  Repeat 2018 (somewhere in [place])- normal except hemorrhoids.  Recall 2028.   KNEE SURGERY     SHOULDER SURGERY      Outpatient Medications Prior to Visit  Medication Sig Dispense Refill   amLODipine -benazepril  (LOTREL) 10-40 MG capsule Take 1 capsule by mouth daily. 90 capsule 3   rosuvastatin  (CRESTOR ) 40 MG tablet Take 1 tablet (40 mg total) by mouth daily. 30 tablet 2   aspirin  EC 81 MG tablet Take 1 tablet  (81 mg total) by mouth daily. (Patient not taking: Reported on 10/30/2023) 30 tablet 0   No facility-administered medications prior to visit.    Allergies  Allergen Reactions   Hctz [Hydrochlorothiazide ] Other (See Comments)    ED    Review of Systems As per HPI  PE:    10/30/2023    3:44 PM 07/29/2023   10:12 AM 01/21/2023    8:52 AM  Vitals with BMI  Height 5' 10 5' 10   Weight 238 lbs 6 oz 235 lbs 13 oz   BMI 34.21 33.83   Systolic 110 105 859  Diastolic 73 67 90  Pulse 66 65    Physical Exam  General: Alert and well-appearing. No further exam today  LABS:  Last CBC Lab Results  Component Value Date   WBC 4.6 07/29/2023   HGB 14.9 07/29/2023   HCT 46.2 07/29/2023   MCV 86.7 07/29/2023   MCH 28.5 06/25/2011   RDW 14.1 07/29/2023   PLT 229.0 07/29/2023   Last metabolic panel Lab Results  Component Value Date   GLUCOSE 91 07/29/2023   NA 142 07/29/2023   K 3.9 07/29/2023   CL 105 07/29/2023   CO2 32 07/29/2023   BUN 18 07/29/2023   CREATININE 1.13 07/29/2023   GFR 68.46 07/29/2023   CALCIUM  9.6 07/29/2023   PROT 7.1 07/29/2023   ALBUMIN 4.0 07/29/2023   BILITOT 0.7 07/29/2023   ALKPHOS 57  07/29/2023   AST 12 09/30/2023   ALT 12 09/30/2023   Last lipids Lab Results  Component Value Date   CHOL 154 09/30/2023   HDL 58.90 09/30/2023   LDLCALC 86 09/30/2023   LDLDIRECT 168.0 01/07/2008   TRIG 46.0 09/30/2023   CHOLHDL 3 09/30/2023   Last hemoglobin A1c Lab Results  Component Value Date   HGBA1C 5.6 07/29/2023   Last thyroid  functions Lab Results  Component Value Date   TSH 1.30 04/18/2020   Last vitamin D  Lab Results  Component Value Date   VD25OH 29.4 10/31/2015   Lab Results  Component Value Date   PSA 3.44 07/29/2023   PSA 3.66 08/26/2022   PSA 1.48 11/11/2017   IMPRESSION AND PLAN:  #1 hypercholesterolemia. Doing well on atorvastatin 20 mg a day. LDL was 86 a couple of months after he got back on this medication. At  that time I recommended he increase his dose to 40 mg a day but he has not started this yet--> He will finish the 20 mg bottle first. We will plan on recheck of lipid panel at next follow-up in 6 months.  2.  Hypertension, well-controlled on amlodipine -benazepril  10-40, 1 daily. Electrolytes and creatinine were normal 3 months ago.  #3 erectile dysfunction. Responded to Cialis in the past. I sent in a prescription today for 10 mg tabs, 1-2 daily as needed, #60, refill x 2.  An After Visit Summary was printed and given to the patient.  FOLLOW UP: Return in about 6 months (around 04/29/2024) for routine chronic illness f/u. Next CPE July 2026 Signed:  Gerlene Hockey, MD           10/30/2023

## 2023-11-21 ENCOUNTER — Telehealth: Payer: Self-pay

## 2023-11-21 NOTE — Telephone Encounter (Signed)
Placed on PCP desk to review and sign, if appropriate.  

## 2023-11-21 NOTE — Telephone Encounter (Signed)
 Type of forms received: [employer]  Routed to:  [employer]  Paperwork received by :  Koltin Wehmeyer   Individual made aware of 5-7 business day turn around (Y/N): Y  Form completed and patient made aware of charges(Y/N): n/a   Faxed to :   Form location:  [employer] inbox front office  Please fax to number on form when complete. Patient wife requests copy to be sent to patient via mychart.

## 2023-12-04 ENCOUNTER — Telehealth: Payer: Self-pay

## 2023-12-04 NOTE — Telephone Encounter (Signed)
 See other encounter

## 2023-12-04 NOTE — Telephone Encounter (Signed)
 Form placed in brown folder for pt pick up

## 2023-12-04 NOTE — Telephone Encounter (Signed)
 Copied from CRM #[id number]. Topic: General - Other >> Dec 04, 2023  9:09 AM Victoria A wrote: Reason for CRM: Patient called to follow up on document that was left on PCP desk 11/21/23-Patient said deadline is tomorrow and he will come pick it up. Please contact patient

## 2023-12-05 NOTE — Telephone Encounter (Signed)
 Form was giving to patient this morning.

## 2023-12-16 ENCOUNTER — Other Ambulatory Visit

## 2024-02-03 ENCOUNTER — Ambulatory Visit: Admitting: Family Medicine

## 2024-02-03 DIAGNOSIS — E7800 Pure hypercholesterolemia, unspecified: Secondary | ICD-10-CM

## 2024-03-02 ENCOUNTER — Ambulatory Visit: Admitting: Family Medicine

## 2024-03-02 DIAGNOSIS — E7800 Pure hypercholesterolemia, unspecified: Secondary | ICD-10-CM

## 2024-03-03 ENCOUNTER — Encounter: Payer: Self-pay | Admitting: Family Medicine
